# Patient Record
Sex: Female | Born: 1975 | Race: White | Hispanic: No | Marital: Married | Smoking: Never smoker
Health system: Southern US, Community
[De-identification: ages and names within clinical notes are randomized; demographics above are authoritative.]

## PROBLEM LIST (undated history)

## (undated) DIAGNOSIS — E669 Obesity, unspecified: Secondary | ICD-10-CM

## (undated) HISTORY — PX: ESSURE TUBAL LIGATION: SUR464

## (undated) HISTORY — DX: Obesity, unspecified: E66.9

---

## 2007-02-20 ENCOUNTER — Ambulatory Visit: Payer: Self-pay | Admitting: Obstetrics & Gynecology

## 2007-03-19 ENCOUNTER — Ambulatory Visit: Payer: Self-pay | Admitting: Obstetrics and Gynecology

## 2007-03-19 ENCOUNTER — Encounter: Payer: Self-pay | Admitting: Obstetrics and Gynecology

## 2007-03-22 ENCOUNTER — Ambulatory Visit (HOSPITAL_COMMUNITY): Admission: RE | Admit: 2007-03-22 | Discharge: 2007-03-22 | Payer: Self-pay | Admitting: Obstetrics & Gynecology

## 2007-04-12 ENCOUNTER — Ambulatory Visit (HOSPITAL_COMMUNITY): Admission: RE | Admit: 2007-04-12 | Discharge: 2007-04-12 | Payer: Self-pay | Admitting: Obstetrics & Gynecology

## 2007-04-17 ENCOUNTER — Ambulatory Visit: Payer: Self-pay | Admitting: Obstetrics & Gynecology

## 2007-04-24 ENCOUNTER — Ambulatory Visit (HOSPITAL_COMMUNITY): Admission: RE | Admit: 2007-04-24 | Discharge: 2007-04-24 | Payer: Self-pay | Admitting: Gynecology

## 2007-05-15 ENCOUNTER — Ambulatory Visit: Payer: Self-pay | Admitting: Obstetrics & Gynecology

## 2007-06-03 ENCOUNTER — Ambulatory Visit: Payer: Self-pay | Admitting: Family

## 2007-07-01 ENCOUNTER — Ambulatory Visit (HOSPITAL_COMMUNITY): Admission: RE | Admit: 2007-07-01 | Discharge: 2007-07-01 | Payer: Self-pay | Admitting: Obstetrics & Gynecology

## 2007-07-01 ENCOUNTER — Ambulatory Visit: Payer: Self-pay | Admitting: Family

## 2007-07-17 ENCOUNTER — Ambulatory Visit: Payer: Self-pay | Admitting: Obstetrics & Gynecology

## 2007-07-22 ENCOUNTER — Ambulatory Visit: Payer: Self-pay | Admitting: Obstetrics & Gynecology

## 2007-07-23 ENCOUNTER — Ambulatory Visit (HOSPITAL_COMMUNITY): Admission: RE | Admit: 2007-07-23 | Discharge: 2007-07-23 | Payer: Self-pay | Admitting: Obstetrics & Gynecology

## 2007-07-31 ENCOUNTER — Ambulatory Visit: Payer: Self-pay | Admitting: Obstetrics & Gynecology

## 2007-08-14 ENCOUNTER — Ambulatory Visit: Payer: Self-pay | Admitting: Obstetrics & Gynecology

## 2007-08-16 ENCOUNTER — Ambulatory Visit: Payer: Self-pay | Admitting: Obstetrics & Gynecology

## 2007-08-16 ENCOUNTER — Ambulatory Visit (HOSPITAL_COMMUNITY): Admission: RE | Admit: 2007-08-16 | Discharge: 2007-08-16 | Payer: Self-pay | Admitting: Obstetrics & Gynecology

## 2007-08-28 ENCOUNTER — Ambulatory Visit: Payer: Self-pay | Admitting: Obstetrics & Gynecology

## 2007-08-31 ENCOUNTER — Inpatient Hospital Stay (HOSPITAL_COMMUNITY): Admission: AD | Admit: 2007-08-31 | Discharge: 2007-09-03 | Payer: Self-pay | Admitting: Obstetrics & Gynecology

## 2007-08-31 ENCOUNTER — Ambulatory Visit: Payer: Self-pay | Admitting: Obstetrics and Gynecology

## 2007-11-26 ENCOUNTER — Ambulatory Visit: Payer: Self-pay | Admitting: Obstetrics & Gynecology

## 2008-02-18 ENCOUNTER — Ambulatory Visit: Payer: Self-pay | Admitting: Obstetrics & Gynecology

## 2008-05-11 ENCOUNTER — Ambulatory Visit: Payer: Self-pay | Admitting: Obstetrics & Gynecology

## 2008-08-04 ENCOUNTER — Ambulatory Visit: Payer: Self-pay | Admitting: Family Medicine

## 2008-08-04 DIAGNOSIS — J310 Chronic rhinitis: Secondary | ICD-10-CM | POA: Insufficient documentation

## 2008-08-17 ENCOUNTER — Ambulatory Visit: Payer: Self-pay | Admitting: Obstetrics & Gynecology

## 2008-11-23 ENCOUNTER — Ambulatory Visit: Payer: Self-pay | Admitting: Obstetrics & Gynecology

## 2008-12-02 ENCOUNTER — Encounter: Payer: Self-pay | Admitting: Obstetrics & Gynecology

## 2008-12-02 ENCOUNTER — Ambulatory Visit: Payer: Self-pay | Admitting: Obstetrics & Gynecology

## 2009-01-12 ENCOUNTER — Ambulatory Visit: Payer: Self-pay | Admitting: Family Medicine

## 2009-01-12 DIAGNOSIS — R635 Abnormal weight gain: Secondary | ICD-10-CM | POA: Insufficient documentation

## 2009-01-13 LAB — CONVERTED CEMR LAB
AST: 15 units/L (ref 0–37)
Albumin: 4.3 g/dL (ref 3.5–5.2)
Alkaline Phosphatase: 60 units/L (ref 39–117)
BUN: 18 mg/dL (ref 6–23)
Glucose, Bld: 85 mg/dL (ref 70–99)
HDL: 45 mg/dL (ref >39)
LDL Cholesterol: 168 mg/dL — ABNORMAL HIGH (ref 0–99)
Potassium: 5 meq/L (ref 3.5–5.3)
Sodium: 140 meq/L (ref 135–145)
TSH: 2.013 microintl units/mL (ref 0.350–4.500)
Total Bilirubin: 0.7 mg/dL (ref 0.3–1.2)
Triglycerides: 99 mg/dL (ref <150)
VLDL: 20 mg/dL (ref 0–40)

## 2009-03-31 ENCOUNTER — Ambulatory Visit: Payer: Self-pay | Admitting: Obstetrics & Gynecology

## 2009-04-20 IMAGING — US US OB COMP +14 WK
1 series · 14 of 28 positions shown · non-contrast
Comparison: none

OBSTETRICAL ULTRASOUND:

 This ultrasound exam was performed in the [HOSPITAL] Ultrasound Department.  The OB US report was generated in the AS system, and faxed to the ordering physician.  This report is also available in [REDACTED] PACS.

[Series 1: us ob comp +14 wk · 14 of 88 slices shown]
[im 4/88]
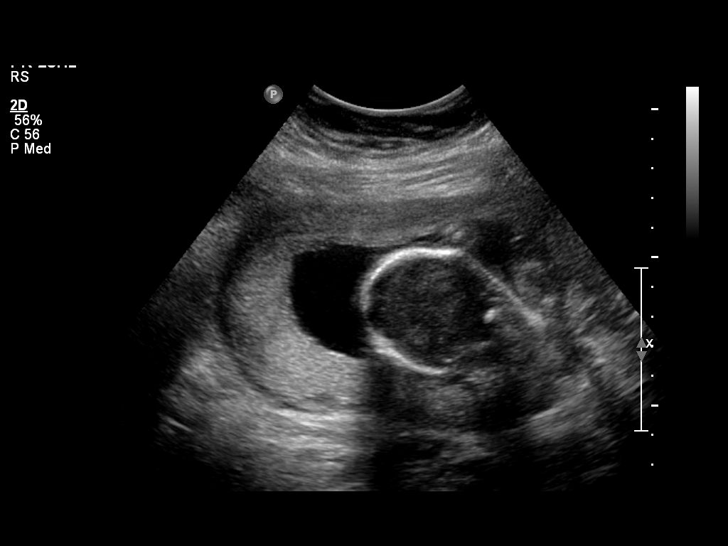
[im 10/88]
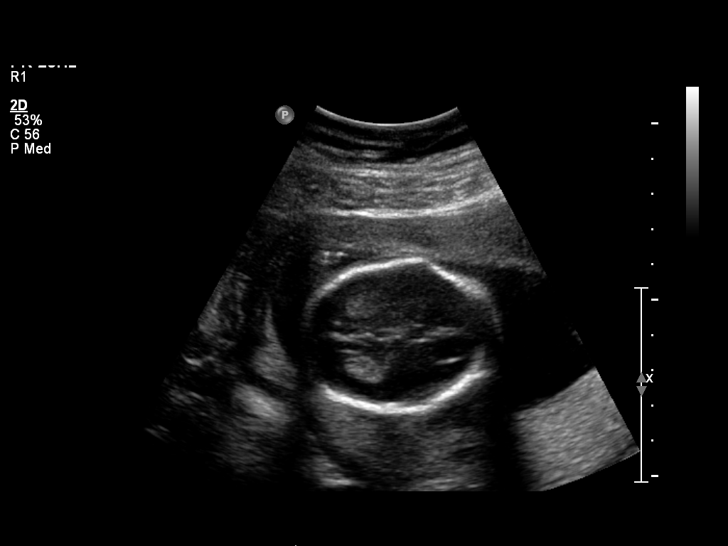
[im 17/88]
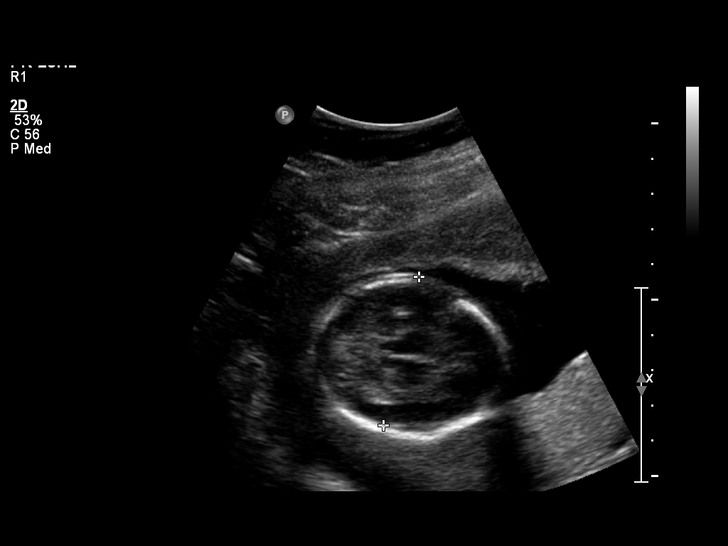
[im 23/88]
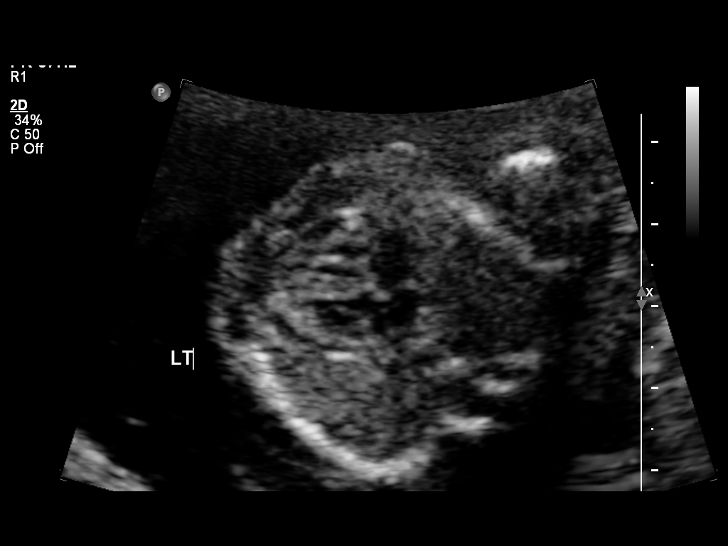
[im 30/88]
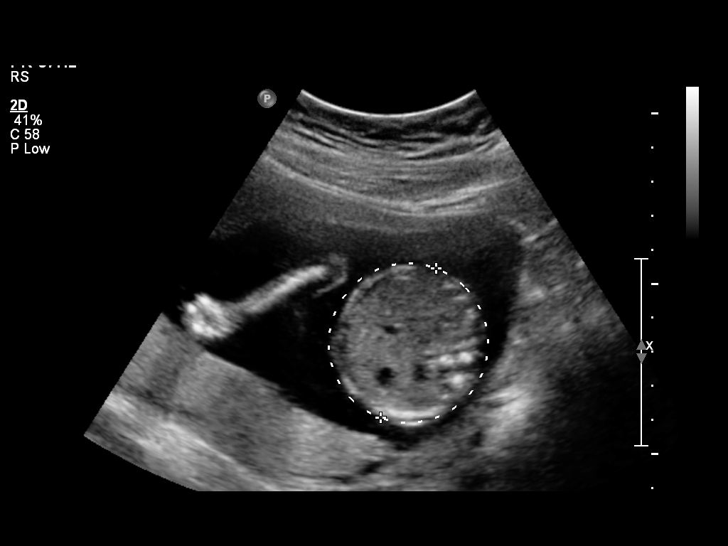
[im 36/88]
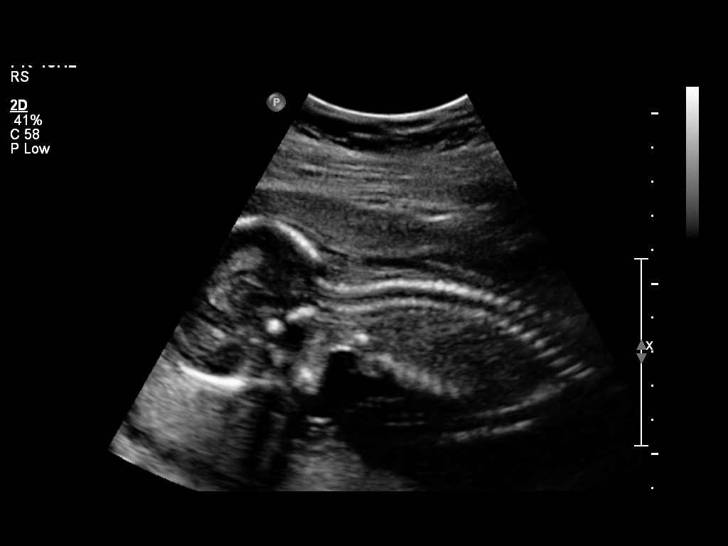
[im 42/88]
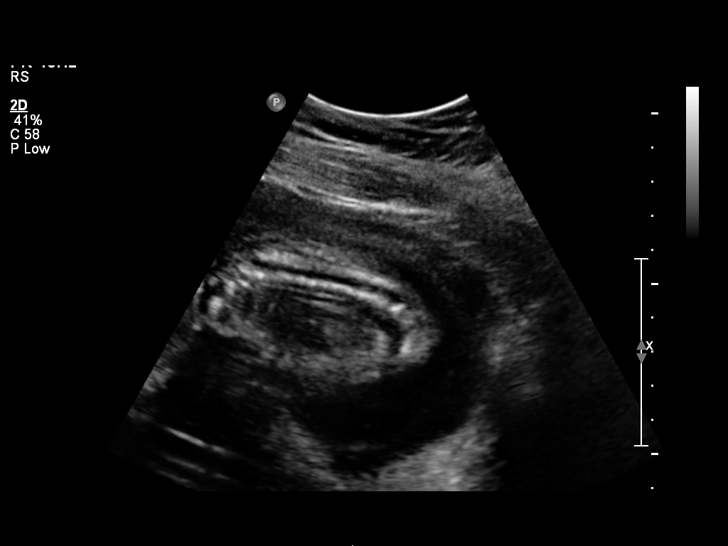
[im 49/88]
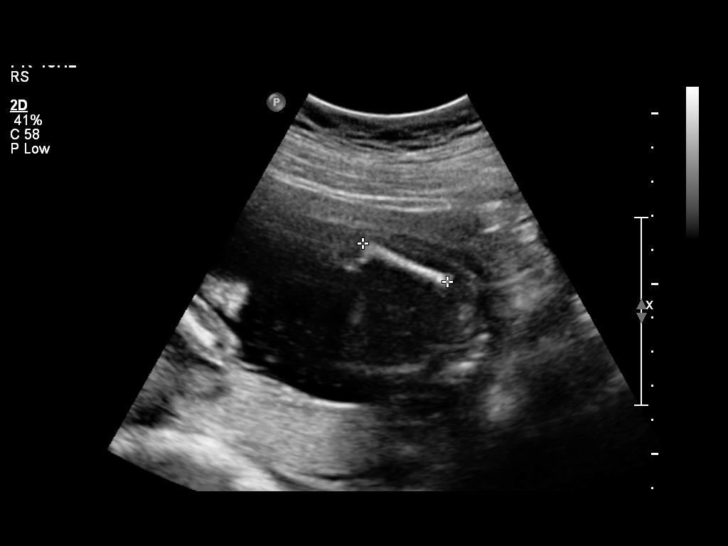
[im 55/88]
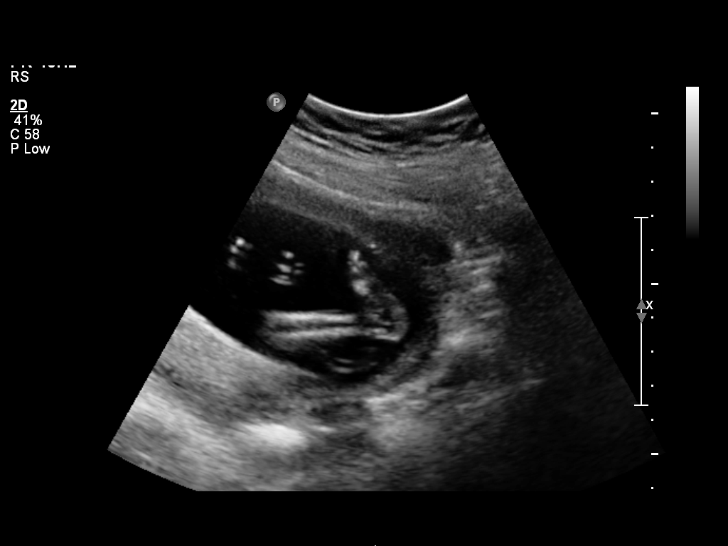
[im 62/88]
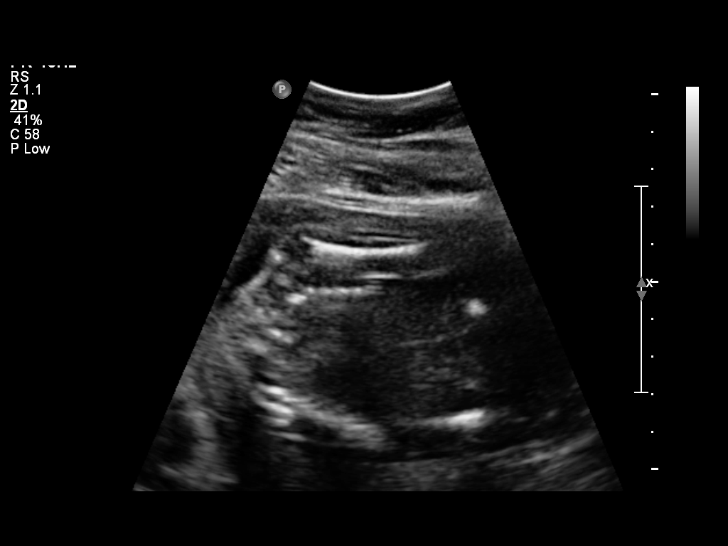
[im 68/88]
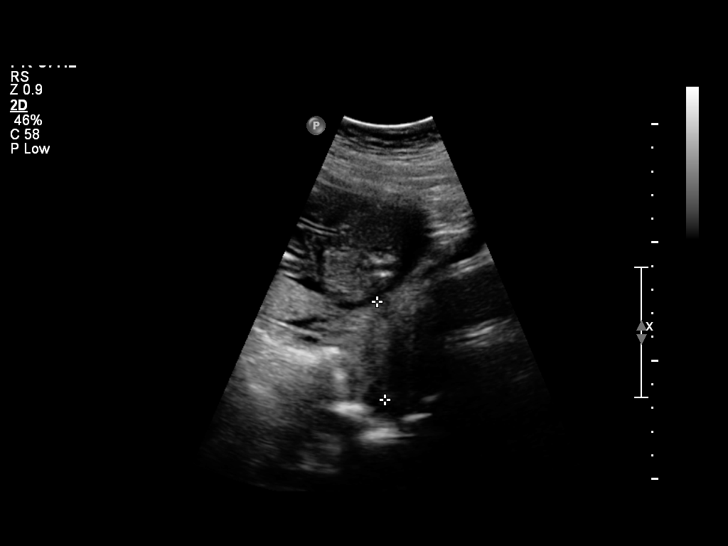
[im 75/88]
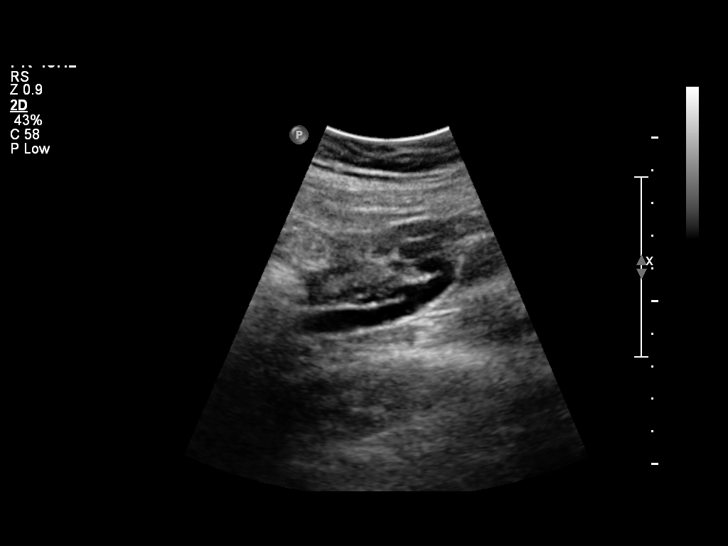
[im 81/88]
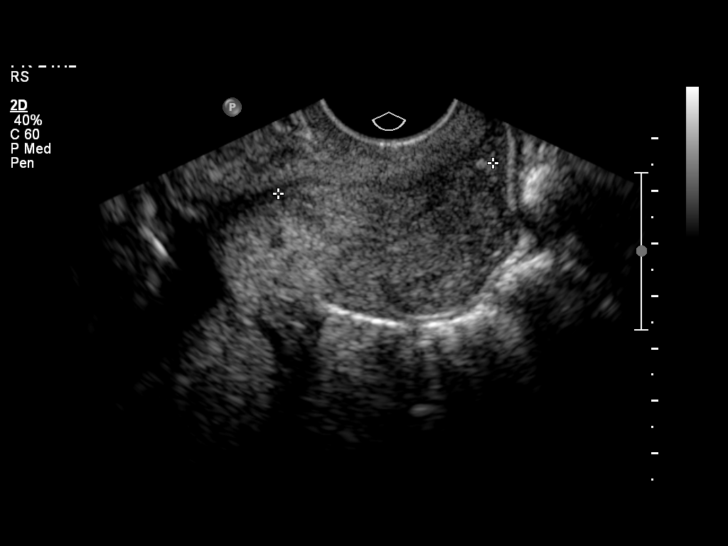
[im 88/88]
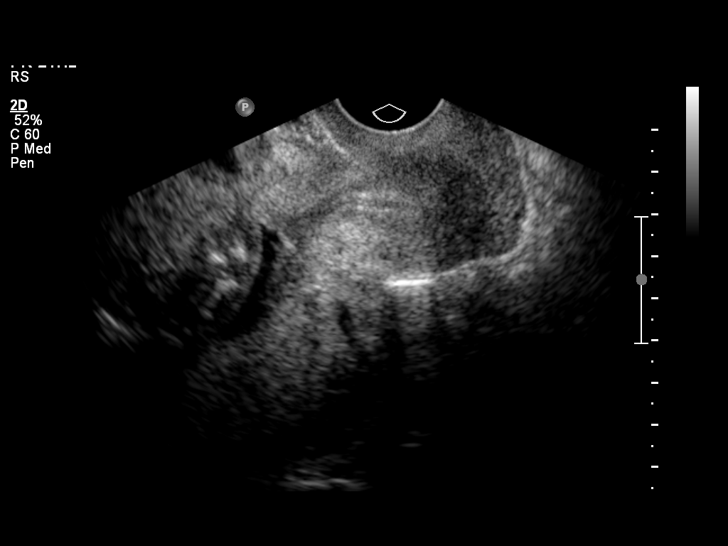

[14 of 28 positions shown; findings below may reference images not displayed]

IMPRESSION: See AS Obstetric US report.

## 2010-03-11 ENCOUNTER — Ambulatory Visit: Admit: 2010-03-11 | Payer: Self-pay | Admitting: Family Medicine

## 2010-05-31 ENCOUNTER — Encounter: Payer: Self-pay | Admitting: Family Medicine

## 2010-06-06 ENCOUNTER — Ambulatory Visit (INDEPENDENT_AMBULATORY_CARE_PROVIDER_SITE_OTHER): Payer: 59 | Admitting: Family Medicine

## 2010-06-06 ENCOUNTER — Encounter: Payer: Self-pay | Admitting: Family Medicine

## 2010-06-06 DIAGNOSIS — Z130 Encounter for screening for diseases of the blood and blood-forming organs and certain disorders involving the immune mechanism: Secondary | ICD-10-CM

## 2010-06-06 DIAGNOSIS — Z0000 Encounter for general adult medical examination without abnormal findings: Secondary | ICD-10-CM

## 2010-06-06 DIAGNOSIS — Z13228 Encounter for screening for other metabolic disorders: Secondary | ICD-10-CM

## 2010-06-06 DIAGNOSIS — Z13220 Encounter for screening for lipoid disorders: Secondary | ICD-10-CM

## 2010-06-06 DIAGNOSIS — N920 Excessive and frequent menstruation with regular cycle: Secondary | ICD-10-CM

## 2010-06-06 NOTE — Progress Notes (Signed)
  Subjective:    Patient ID: Rebekah Porter, female    DOB: November 03, 1975, 35 y.o.   MRN: [id number]  HPI [age] yo WF presents for CPE without pap smear.  LMP was 2 wks ago. Premenopausal.  Uses Essure. for contraception.  No hx of abnormal pap.  Denies irregular bleeding or vag discharge but her periods are heavier since she had Essure last year.  She has no fam hx of breast or colon cancer.  Her last tetanus vaccine was2008..  She eats fairly poorly and does not exercises on a regular basis.  Denies fam hx of premature heart disease.      MVI/ Calcium with D- not taking. BP 123/73  Pulse 75  Ht 5\' 5"  (1.651 m)  Wt 185 lb (83.915 kg)  BMI 30.79 kg/m2  SpO2 97%  LMP 06/05/2010  Past Medical History  Diagnosis Date  . Obesity     No past surgical history on file.  Family History  Problem Relation Age of Onset  . Asthma Mother     chronic    History   Social History  . Marital Status: Married    Spouse Name: N/A    Number of Children: N/A  . Years of Education: N/A   Occupational History  . Not on file.   Social History Main Topics  . Smoking status: Never Smoker   . Smokeless tobacco: Not on file  . Alcohol Use: No  . Drug Use:   . Sexually Active:    Other Topics Concern  . Not on file   Social History Narrative  . No narrative on file    Not on File  Current outpatient prescriptions:MedroxyPROGESTERone Acetate (DEPO-PROVERA IM), Inject into the muscle as directed.  , Disp: , Rfl:    Review of Systems Gen: no fevers, chills, hot flashes, night sweats, change in weight GI: no N/V/C/D GU: no dysuria, incontinence or sexual dysfunction CV: no chest pain, DOE, palpitations s or edema Pulm:  Denies CP, SOB or chronic cough      Objective:   Physical Exam Gen: alert, well groomed in NAD, overwt. Neck: no thyromegaly or cervical lymphadenopathy CV: RRR w/o murmur, no audible carotid bruits or abdominal aortic bruits,2+ pedal pulses. Ext: no edema, clubbing or  cyanosis Lungs: CTA bilat w/o W/R/R; nonlabored HEENT:  [place]/AT; PERRLA; oropharynx pink and moist with good dentition Skin: warm and dry; no rash, pallor or jaundice Psych: does not appear anxious or depressed; answers questions appropriately        Assessment & Plan:  Assesment:  1. CPE- Keeping healthy checklist for women reviewed today.  BP at goal.  BMI 30  in the class I obesity range.     Labs ordered Contraception- Essure last year. Will call her gyn to update her pap this year. Mammogram due at 41. Encouraged healthy diet, regular exercise, MVI daily. Return for next physical in 1 yr.

## 2010-06-09 ENCOUNTER — Telehealth: Payer: Self-pay | Admitting: Family Medicine

## 2010-06-09 LAB — COMPREHENSIVE METABOLIC PANEL
AST: 13 U/L (ref 0–37)
Alkaline Phosphatase: 48 U/L (ref 39–117)
BUN: 18 mg/dL (ref 6–23)
Creat: 0.8 mg/dL (ref 0.40–1.20)
Glucose, Bld: 80 mg/dL (ref 70–99)
Total Bilirubin: 0.4 mg/dL (ref 0.3–1.2)

## 2010-06-09 LAB — LIPID PANEL
HDL: 48 mg/dL (ref >39)
LDL Cholesterol: 136 mg/dL — ABNORMAL HIGH (ref 0–99)
Total CHOL/HDL Ratio: 4.2 Ratio
Triglycerides: 78 mg/dL (ref <150)
VLDL: 16 mg/dL (ref 0–40)

## 2010-06-09 NOTE — Telephone Encounter (Signed)
Pls let pt know that her fasting sugar, liver, kidney function and cholesterol came back normal.  Repeat in 1-2 yrs.

## 2010-06-10 NOTE — Telephone Encounter (Signed)
LMOM informing Pt of the above 

## 2010-06-22 ENCOUNTER — Ambulatory Visit: Payer: 59 | Admitting: Obstetrics & Gynecology

## 2010-07-26 NOTE — Assessment & Plan Note (Signed)
Rebekah Porter, SALVADOR NO.:  [id number]   MEDICAL RECORD NO.:  [id number]          PATIENT TYPE:  POB   LOCATION:  [facility] at [place]         FACILITY:  [facility]   PHYSICIAN:  Allie Bossier, MD        DATE OF BIRTH:  03-20-1975   DATE OF SERVICE:  12/02/2008                                  CLINIC NOTE   Ms. Alberta is a 35 year old married white gravida 1, para 1.  She has a  69-month-old son named Marcello Moores.  She comes in here for annual exam.  She  has no particular GYN complaints.  She does say that she is having  difficulty losing weight.  She currently weighs 191 pounds.  She uses  Depo-Provera for birth control.   PAST MEDICAL HISTORY:  She is overweight/obesity.   PAST SURGICAL HISTORY:  None.   REVIEW OF SYSTEMS:  She has been married for 7 years.  She is a  Futures trader.  She does not desire more children.  Her last Pap smear was  done in January 2009 and it was normal.   SOCIAL HISTORY:  Negative for tobacco, alcohol, or drug use.   ALLERGIES:  No known drug allergies.  No latex allergies.   FAMILY HISTORY:  Negative for breast, GYN, and colon malignancies.   MEDICATIONS:  1. Over-the-counter prenatal vitamins daily.  2. Depo-Provera q.12 weeks.   PHYSICAL EXAMINATION:  VITAL SIGNS:  Weight 191 pounds, height 5 feet 5,  blood pressure 125/79, and pulse 81.  HEENT:  Normal.  HEART:  Regular rate and rhythm.  LUNGS:  Clear to auscultation bilaterally.  BREASTS:  Normal bilaterally.  ABDOMEN:  Obese and benign.  No hepatosplenomegaly.  EXTERNAL GENITALIA:  No lesions.  Cervix parous.  No lesions or abnormal  discharge.  Uterus normal size and shape midplane, minimally mobile.  Adnexa nontender.  No masses.   ASSESSMENT/PLAN:  1. Annual exam.  I have checked a Pap smear and recommended self-      breast and self-vulvar exams monthly.  2. Birth control.  We have discussed Mirena and she will plan on      scheduling an insertion.  This will take away  Depo-Provera, so that      she can hopefully lose weight more easily.      Allie Bossier, MD     MCD/MEDQ  D:  12/02/2008  T:  12/03/2008  Job:  [id number]

## 2010-07-26 NOTE — Assessment & Plan Note (Signed)
Rebekah Porter, Rebekah Porter NO.:  [id number]   MEDICAL RECORD NO.:  [id number]          PATIENT TYPE:  POB   LOCATION:  [facility] [place]         FACILITY:  [facility]   PHYSICIAN:  Rebekah Lincoln, MD      DATE OF BIRTH:  March 18, 1975   DATE OF SERVICE:  03/31/2009                                  CLINIC NOTE   This is a 35 year old female who presents for discussion of birth  control options.  The patient was off Depo-Provera.  Her last shot was  in September.  She was due in December.  She wants the Essure procedure,  but she cannot afford it doing in the hospital as her insurance will  charge her 3000 dollars.  She has an appointment with [facility] to discuss  the in-office procedure in February.  She would like to do with [place], but  we are not set up to do it in the office.  She would like to be off the  Depo, so we discussed oral contraceptives.  She has been on oral  contraceptives in the past.  She has been on Ortho Tri-Cyclen.  Gave  samples of Ortho Tri-Cyclen Lo.  I gave her 2 samples of Ortho Tri-  Cyclen Lo and a prescription for Ortho Tri-Cyclen Lo.   MEDICAL HISTORY:  She has never had any history of hypertension, stroke,  heart attack, blood clots in her legs and lungs.  Her blood pressure  today is 126/77.  She is familiar how to take the birth control pill and  how to take them if she misses a dose.  The patient will return to [place]  for regular GYN care.  If the patient does not like [facility], I can find  her another Miniya Miguez you can do it in office.  I wish that we could  provide the service for her in office, but we have not set up yet.  The  patient is to start the birth control pill with onset of her menses.           ______________________________  Rebekah Lincoln, MD     KL/MEDQ  D:  03/31/2009  T:  04/01/2009  Job:  [id number]

## 2010-12-08 LAB — CBC
HCT: 35.3 — ABNORMAL LOW
Platelets: 304
RDW: 13.2
WBC: 10.7 — ABNORMAL HIGH

## 2010-12-08 LAB — RPR: RPR Ser Ql: NONREACTIVE

## 2011-01-29 ENCOUNTER — Emergency Department (INDEPENDENT_AMBULATORY_CARE_PROVIDER_SITE_OTHER)
Admission: EM | Admit: 2011-01-29 | Discharge: 2011-01-29 | Disposition: A | Discharge status: Home / Self Care | Payer: 59 | Source: Home / Self Care

## 2011-01-29 DIAGNOSIS — R059 Cough, unspecified: Secondary | ICD-10-CM

## 2011-01-29 DIAGNOSIS — R05 Cough: Secondary | ICD-10-CM

## 2011-01-29 DIAGNOSIS — J069 Acute upper respiratory infection, unspecified: Secondary | ICD-10-CM

## 2011-01-29 MED ORDER — AZITHROMYCIN 250 MG PO TABS: ORAL_TABLET | ORAL | Status: AC

## 2011-01-29 MED ORDER — GUAIFENESIN-CODEINE 100-10 MG/5ML PO SYRP: 5.0000 mL | ORAL_SOLUTION | Freq: Three times a day (TID) | ORAL | Status: AC | PRN

## 2011-01-29 NOTE — ED Notes (Signed)
States symptoms started a week ago

## 2011-01-29 NOTE — ED Provider Notes (Signed)
History     CSN: [id number] Arrival date & time: 01/29/2011  5:50 PM   None     Chief Complaint  Patient presents with  . Cough    (Consider location/radiation/quality/duration/timing/severity/associated sxs/prior treatment) HPI Rebekah Porter is a 35 y.o. female who complains of onset of cold symptoms for a few days.  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  She is mostly concerned because she has a child, who has asthma and she does not want them to get sick. + sore throat + cough No pleuritic pain No wheezing + nasal congestion + post-nasal drainage + sinus pain/pressure + chest congestion No itchy/red eyes No earache No hemoptysis No SOB No chills/sweats No fever No nausea No vomiting No abdominal pain No diarrhea No skin rashes No fatigue No myalgias No headache     Past Medical History  Diagnosis Date  . Obesity     History reviewed. No pertinent past surgical history.  Family History  Problem Relation Age of Onset  . Asthma Mother     chronic    History  Substance Use Topics  . Smoking status: Never Smoker   . Smokeless tobacco: Not on file  . Alcohol Use: No    OB History    Grav Para Term Preterm Abortions TAB SAB Ect Mult Living                  Review of Systems  Allergies  Review of patient's allergies indicates no known allergies.  Home Medications   Current Outpatient Rx  Name Route Sig Dispense Refill  . AZITHROMYCIN 250 MG PO TABS  Use as directed 1 each 0  . GUAIFENESIN-CODEINE 100-10 MG/5ML PO SYRP Oral Take 5 mLs by mouth 3 (three) times daily as needed for cough. 120 mL 0    BP 111/75  Temp(Src) 98.5 F (36.9 C) (Oral)  Resp 24  Ht 5\' 5"  (1.651 m)  Wt 182 lb (82.555 kg)  BMI 30.29 kg/m2  SpO2 99%  LMP 01/28/2011  Physical Exam  Nursing note and vitals reviewed. Constitutional: She is oriented to person, place, and time. She appears well-developed and well-nourished.  HENT:  Head:  Normocephalic and atraumatic.  Right Ear: Tympanic membrane, external ear and ear canal normal.  Left Ear: Tympanic membrane, external ear and ear canal normal.  Nose: Mucosal edema and rhinorrhea present.  Mouth/Throat: Posterior oropharyngeal erythema present. No oropharyngeal exudate or posterior oropharyngeal edema.  Neck: Neck supple.  Cardiovascular: Regular rhythm and normal heart sounds.   Pulmonary/Chest: Effort normal and breath sounds normal. No respiratory distress.  Neurological: She is alert and oriented to person, place, and time.  Skin: Skin is warm and dry.  Psychiatric: She has a normal mood and affect. Her speech is normal.    ED Course  Procedures (including critical care time)  Labs Reviewed - No data to display No results found.   1. Acute upper respiratory infections of unspecified site   2. Cough       MDM   1)  Take the prescribed antibiotic as instructed. 2)  Use nasal saline solution (over the counter) at least 3 times a day. 3)  Use over the counter decongestants like Zyrtec-D every 12 hours as needed to help with congestion.  If you have hypertension, do not take medicines with sudafed.  4)  Can take tylenol every 6 hours or motrin every 8 hours for pain or fever. 5)  Follow  up with your primary doctor if no improvement in 5-7 days, sooner if increasing pain, fever, or new symptoms.         Lily Kocher, MD 01/29/11 [phone]

## 2011-02-16 ENCOUNTER — Emergency Department
Admission: EM | Admit: 2011-02-16 | Discharge: 2011-02-16 | Disposition: A | Discharge status: Home / Self Care | Payer: 59 | Source: Home / Self Care | Attending: Emergency Medicine | Admitting: Emergency Medicine

## 2011-02-16 ENCOUNTER — Encounter: Payer: Self-pay | Admitting: *Deleted

## 2011-02-16 DIAGNOSIS — J111 Influenza due to unidentified influenza virus with other respiratory manifestations: Secondary | ICD-10-CM

## 2011-02-16 NOTE — ED Provider Notes (Signed)
History     CSN: [id number] Arrival date & time: 02/16/2011 10:58 AM   First MD Initiated Contact with Patient 02/16/11 1040      Chief Complaint  Patient presents with  . Cough    (Consider location/radiation/quality/duration/timing/severity/associated sxs/prior treatment) HPI Yoanna is a 35 y.o. female who complains of onset of cold symptoms for 5  days. Her son has been diagnosed with influenza by lab test. No sore throat + cough No pleuritic pain No wheezing No nasal congestion No post-nasal drainage No sinus pain/pressure No chest congestion No itchy/red eyes No earache No hemoptysis No SOB + chills/sweats + fever No nausea No vomiting No abdominal pain No diarrhea No skin rashes + fatigue + myalgias + headache    Past Medical History  Diagnosis Date  . Obesity     History reviewed. No pertinent past surgical history.  Family History  Problem Relation Age of Onset  . Asthma Mother     chronic    History  Substance Use Topics  . Smoking status: Never Smoker   . Smokeless tobacco: Not on file  . Alcohol Use: No    OB History    Grav Para Term Preterm Abortions TAB SAB Ect Mult Living                  Review of Systems  Allergies  Review of patient's allergies indicates no known allergies.  Home Medications  No current outpatient prescriptions on file.  BP 103/74  Pulse 110  Temp(Src) 98.9 F (37.2 C) (Oral)  Resp 16  Ht 5\' 5"  (1.651 m)  Wt 179 lb (81.194 kg)  BMI 29.79 kg/m2  SpO2 98%  LMP 01/28/2011  Physical Exam  Nursing note and vitals reviewed. Constitutional: She is oriented to person, place, and time. She appears well-developed and well-nourished.  HENT:  Head: Normocephalic and atraumatic.  Right Ear: Tympanic membrane, external ear and ear canal normal.  Left Ear: Tympanic membrane, external ear and ear canal normal.  Nose: Mucosal edema and rhinorrhea present.  Mouth/Throat: Posterior oropharyngeal erythema  present. No oropharyngeal exudate or posterior oropharyngeal edema.  Neck: Neck supple.  Cardiovascular: Regular rhythm and normal heart sounds.   Pulmonary/Chest: Effort normal and breath sounds normal. No respiratory distress.  Neurological: She is alert and oriented to person, place, and time.  Skin: Skin is warm and dry.  Psychiatric: She has a normal mood and affect. Her speech is normal.    ED Course  Procedures (including critical care time)  Labs Reviewed - No data to display No results found.   No diagnosis found.    MDM  1) her symptoms are consistent with the influenza. I did not test her because it started in 5 days and she is already getting better. I gave her a note for work.Marland Kitchen 2)  Use nasal saline solution (over the counter) at least 3 times a day. 3)  Use over the counter decongestants like Zyrtec-D every 12 hours as needed to help with congestion.  If you have hypertension, do not take medicines with sudafed.  4)  Can take tylenol every 6 hours or motrin every 8 hours for pain or fever. 5)  Follow up with your primary doctor if no improvement in 5-7 days, sooner if increasing pain, fever, or new symptoms.       Lily Kocher, MD 02/16/11 1110

## 2011-02-16 NOTE — ED Notes (Signed)
Patient c/o weakness, body aches, cough and fever x 5 days. Yesterday was "the worst of it", she is improving today and has not had a feer. Her son tested positive for the flu.

## 2011-03-16 ENCOUNTER — Ambulatory Visit: Payer: 59 | Admitting: Family Medicine

## 2011-03-16 DIAGNOSIS — Z0289 Encounter for other administrative examinations: Secondary | ICD-10-CM

## 2011-12-07 ENCOUNTER — Ambulatory Visit: Payer: 59 | Admitting: Obstetrics & Gynecology

## 2011-12-07 DIAGNOSIS — Z124 Encounter for screening for malignant neoplasm of cervix: Secondary | ICD-10-CM

## 2013-05-09 ENCOUNTER — Ambulatory Visit: Payer: 59 | Admitting: Physician Assistant

## 2013-05-09 ENCOUNTER — Ambulatory Visit (INDEPENDENT_AMBULATORY_CARE_PROVIDER_SITE_OTHER): Payer: 59 | Admitting: Physician Assistant

## 2013-05-09 ENCOUNTER — Encounter: Payer: Self-pay | Admitting: Physician Assistant

## 2013-05-09 VITALS — BP 109/67 | HR 79 | Ht 65.0 in | Wt 190.0 lb

## 2013-05-09 DIAGNOSIS — E669 Obesity, unspecified: Secondary | ICD-10-CM | POA: Insufficient documentation

## 2013-05-09 DIAGNOSIS — Z131 Encounter for screening for diabetes mellitus: Secondary | ICD-10-CM

## 2013-05-09 DIAGNOSIS — Z13220 Encounter for screening for lipoid disorders: Secondary | ICD-10-CM

## 2013-05-09 DIAGNOSIS — Z6831 Body mass index (BMI) 31.0-31.9, adult: Secondary | ICD-10-CM

## 2013-05-09 DIAGNOSIS — R635 Abnormal weight gain: Secondary | ICD-10-CM

## 2013-05-09 MED ORDER — PHENTERMINE HCL 37.5 MG PO TABS: 37.5000 mg | ORAL_TABLET | Freq: Every day | ORAL | Status: DC

## 2013-05-11 NOTE — Progress Notes (Signed)
   Subjective:    Patient ID: Rebekah Porter, female    DOB: [birth year], 38 y.o.   MRN: [id number]  HPI Pt is a 38 yo female who presents to the clinic to establish care. Pt is not on any medications.   . Active Ambulatory Problems    Diagnosis Date Noted  . RHINITIS 08/04/2008  . WEIGHT GAIN 01/12/2009  . Routine general medical examination at a health care facility 06/06/2010  . BMI 31.0-31.9,adult 05/09/2013   Resolved Ambulatory Problems    Diagnosis Date Noted  . No Resolved Ambulatory Problems   Past Medical History  Diagnosis Date  . Obesity    . History   Social History  . Marital Status: Married    Spouse Name: N/A    Number of Children: N/A  . Years of Education: N/A   Occupational History  . Not on file.   Social History Main Topics  . Smoking status: Never Smoker   . Smokeless tobacco: Not on file  . Alcohol Use: No  . Drug Use: No  . Sexual Activity: Yes   Other Topics Concern  . Not on file   Social History Narrative  . No narrative on file   . Family History  Problem Relation Age of Onset  . Asthma Mother     chronic  . Alcohol abuse Father   . Diabetes Paternal Grandmother   . Hypertension Paternal Grandmother     Pt would like help losing weight. She admits to over eating but needs jump start. No current exercise. She has tried fad diets but has not been sticking to them.    Review of Systems  All other systems reviewed and are negative.       Objective:   Physical Exam  Constitutional: She is oriented to person, place, and time. She appears well-developed and well-nourished.  Obese.  HENT:  Head: Normocephalic and atraumatic.  Cardiovascular: Normal rate, regular rhythm and normal heart sounds.   Pulmonary/Chest: Effort normal and breath sounds normal. She has no wheezes.  Neurological: She is alert and oriented to person, place, and time.  Skin: Skin is dry.  Psychiatric: She has a normal mood and affect. Her behavior is  normal.          Assessment & Plan:  Abnormal weight gain/BMI 31- Depression screen was negative at 0/2. Needs screening labs ordered today. Discussed my fitness pal and limiting calories. Encouraged pt to start exercising 150 minutes a week. Discussed belviq, contrave, qysmia. Will start with phentermine. Discussed SE of phentermine and if she has any palpitations, insomnia to call office and stop medication . Will follow up in 1 month. Would like for her to see me to discuss diet and exerise. Pt aware she will have to make those changes for medication to work.

## 2013-05-15 LAB — COMPLETE METABOLIC PANEL WITH GFR
ALBUMIN: 4.5 g/dL (ref 3.5–5.2)
ALK PHOS: 54 U/L (ref 39–117)
ALT: 15 U/L (ref 0–35)
AST: 15 U/L (ref 0–37)
BILIRUBIN TOTAL: 0.6 mg/dL (ref 0.2–1.2)
BUN: 21 mg/dL (ref 6–23)
CO2: 27 mEq/L (ref 19–32)
Calcium: 9.5 mg/dL (ref 8.4–10.5)
Chloride: 104 mEq/L (ref 96–112)
Creat: 0.83 mg/dL (ref 0.50–1.10)
GFR, Est African American: >89 mL/min
GFR, Est Non African American: >89 mL/min
Glucose, Bld: 80 mg/dL (ref 70–99)
POTASSIUM: 5.1 meq/L (ref 3.5–5.3)
SODIUM: 139 meq/L (ref 135–145)
Total Protein: 6.9 g/dL (ref 6.0–8.3)

## 2013-05-15 LAB — LIPID PANEL
CHOL/HDL RATIO: 5.5 ratio
Cholesterol: 264 mg/dL — ABNORMAL HIGH (ref 0–200)
HDL: 48 mg/dL (ref >39)
LDL Cholesterol: 204 mg/dL — ABNORMAL HIGH (ref 0–99)
Triglycerides: 61 mg/dL (ref <150)
VLDL: 12 mg/dL (ref 0–40)

## 2013-05-16 LAB — VITAMIN D 25 HYDROXY (VIT D DEFICIENCY, FRACTURES): Vit D, 25-Hydroxy: 45 ng/mL (ref 30–89)

## 2013-05-16 LAB — TSH: TSH: 3.019 u[IU]/mL (ref 0.350–4.500)

## 2013-05-16 LAB — VITAMIN B12: VITAMIN B 12: 436 pg/mL (ref 211–911)

## 2013-05-20 ENCOUNTER — Other Ambulatory Visit: Payer: Self-pay | Admitting: Physician Assistant

## 2013-05-20 MED ORDER — ATORVASTATIN CALCIUM 40 MG PO TABS: 40.0000 mg | ORAL_TABLET | Freq: Every day | ORAL | Status: DC

## 2013-08-15 ENCOUNTER — Ambulatory Visit (INDEPENDENT_AMBULATORY_CARE_PROVIDER_SITE_OTHER): Payer: 59 | Admitting: Physician Assistant

## 2013-08-15 ENCOUNTER — Encounter: Payer: Self-pay | Admitting: Physician Assistant

## 2013-08-15 VITALS — BP 114/73 | HR 76 | Ht 65.0 in | Wt 198.0 lb

## 2013-08-15 DIAGNOSIS — R635 Abnormal weight gain: Secondary | ICD-10-CM

## 2013-08-15 DIAGNOSIS — R41840 Attention and concentration deficit: Secondary | ICD-10-CM

## 2013-08-15 DIAGNOSIS — Z6831 Body mass index (BMI) 31.0-31.9, adult: Secondary | ICD-10-CM

## 2013-08-15 MED ORDER — NALTREXONE-BUPROPION HCL ER 8-90 MG PO TB12
ORAL_TABLET | ORAL | Status: DC
Start: 2013-08-15 — End: 2014-06-03

## 2013-08-15 MED ORDER — NALTREXONE-BUPROPION HCL ER 8-90 MG PO TB12: ORAL_TABLET | ORAL | Status: DC

## 2013-08-15 NOTE — Patient Instructions (Signed)
Get ADHD testing. Contrave start.   Follow up in 6 weeks.

## 2013-08-15 NOTE — Progress Notes (Signed)
   Subjective:    Patient ID: Rebekah Porter, female    DOB: Jun 07, 1975, 38 y.o.   MRN: [id number]  HPI Patient is a 38 year old female who presents to the clinic to discuss weight loss. Patient has tried phentermine multiple times. The first time she tried phentermine she had significant weight loss at greater than 20 pounds. She quickly gained her weight back and tried it again. Every time she seems to try phentermine again she has less results. She has recently started Weight Watchers along with phentermine and lost 8 pounds. She is very frustrated currently with her weight. She has done some research on her own and has some medications that she would like to try. One medication is called Desoxyn. She is also interested in [place]. Patient are red he has a fifth it and trying to exercise more often. She often wonders about her lack of focus. She can't seem to stay focused on task at home or at work. She can be very focused on symptom but leave the room and completely forget what she is doing. She has never been tested for ADHD. She wonders if this could be affecting her weight loss as well. She is to have is she's ever been in her life. She denies any side effects with current dose of phentermine.   Review of Systems  All other systems reviewed and are negative.      Objective:   Physical Exam  Constitutional: She appears well-developed and well-nourished.  Obese  HENT:  Head: Normocephalic.  Cardiovascular: Normal rate, regular rhythm and normal heart sounds.   Pulmonary/Chest: Effort normal.  Psychiatric: She has a normal mood and affect. Her behavior is normal.          Assessment & Plan:  Obesity-BMI greater than 31- after discussing multiple weight loss options. We have come to the conclusion to try contrave. Patient was given coupon car along with informational a clip. Patient encouraged to sign up for the scale down program. Would like to recheck patient in 6 weeks. Side  effects of medication discussed. Discuss that she is not going to fill the immediate reaction like she does with phentermine however the medication will work more subtly and she can take for longer period of time. Encouraged patient to continue Weight Watchers. Encouraged patient to continue regular exercise.  Inattention- we'll certainly have patient formally evaluated for ADHD. I am not sure if this is affecting weight loss are not but that could be affecting her quality of life if she is truly not able to focus on task at hand. Will followup after evaluation.  Spent 30 minutes with patient greater than 50% of visit spent counseling patient regarding weight loss and weight loss drugs.

## 2013-08-18 DIAGNOSIS — R41840 Attention and concentration deficit: Secondary | ICD-10-CM | POA: Insufficient documentation

## 2013-09-15 ENCOUNTER — Other Ambulatory Visit: Payer: Self-pay | Admitting: Physician Assistant

## 2013-10-15 ENCOUNTER — Ambulatory Visit: Payer: 59 | Admitting: Psychology

## 2014-06-03 ENCOUNTER — Ambulatory Visit (INDEPENDENT_AMBULATORY_CARE_PROVIDER_SITE_OTHER): Payer: 59 | Admitting: Physician Assistant

## 2014-06-03 ENCOUNTER — Encounter: Payer: Self-pay | Admitting: Physician Assistant

## 2014-06-03 VITALS — BP 115/66 | HR 70 | Ht 65.0 in | Wt 207.0 lb

## 2014-06-03 DIAGNOSIS — F4323 Adjustment disorder with mixed anxiety and depressed mood: Secondary | ICD-10-CM

## 2014-06-03 MED ORDER — CLONAZEPAM 0.5 MG PO TABS: 0.5000 mg | ORAL_TABLET | Freq: Two times a day (BID) | ORAL | Status: DC | PRN

## 2014-06-03 MED ORDER — CITALOPRAM HYDROBROMIDE 10 MG PO TABS: ORAL_TABLET | ORAL | Status: DC

## 2014-06-05 DIAGNOSIS — F4323 Adjustment disorder with mixed anxiety and depressed mood: Secondary | ICD-10-CM | POA: Insufficient documentation

## 2014-06-05 NOTE — Progress Notes (Signed)
   Subjective:    Patient ID: Rebekah Porter, female    DOB: 1975/05/09, 39 y.o.   MRN: [id number]  HPI Pt presents to the clinic for help with her anxiety and depression. Does not have a hx of this. She feels like she is in "flight or fight" all the time. Her mother in law is currently leaving with them and very stressful. Her mother in law is doing a lot of things to get under her skin and her husband is doing nothing about it. She feels like her husband is not listening to her. She feels all a lone. She works from home and around her mother in law in close quarters. Her mother in law has a problem with abusing pain pills as well and she feels like she has to keep up with that. If she tries to talk about this with her husband she blows up and then just wants to get away. She feels she needs some medication to stablize her so she can have hard conversations without blowing up. No suicidal thoughts.    Review of Systems  All other systems reviewed and are negative.      Objective:   Physical Exam  Constitutional: She is oriented to person, place, and time. She appears well-developed and well-nourished.  HENT:  Head: Normocephalic and atraumatic.  Cardiovascular: Normal rate, regular rhythm and normal heart sounds.   Pulmonary/Chest: Effort normal and breath sounds normal.  Neurological: She is alert and oriented to person, place, and time.  Psychiatric: Her behavior is normal.  Very tearful when talking about her circumstances.           Assessment & Plan:  Adjustment disorder for anxiety and depression- GAD-7 was 20. PHQ-9 was 4. Discussed personally counselling. Pt agreed to look into. celexa started with taper up to 20mg  over next 2 weeks. Clonazepam as needed up to twice a day. Abuse potential and not to overuse. Encouraged her to have hard conversations with family and make some boundries. Follow up in 1 month.   Spent 30 minutes with patient and greater than 50 percent of visit  spent counseling pt on treatment plan and setting boundaries.

## 2014-06-08 ENCOUNTER — Ambulatory Visit: Payer: Self-pay | Admitting: Physician Assistant

## 2014-07-03 ENCOUNTER — Ambulatory Visit (INDEPENDENT_AMBULATORY_CARE_PROVIDER_SITE_OTHER): Payer: 59 | Admitting: Physician Assistant

## 2014-07-03 ENCOUNTER — Encounter: Payer: Self-pay | Admitting: Physician Assistant

## 2014-07-03 VITALS — BP 112/72 | HR 66 | Ht 65.0 in | Wt 196.0 lb

## 2014-07-03 DIAGNOSIS — F4323 Adjustment disorder with mixed anxiety and depressed mood: Secondary | ICD-10-CM | POA: Diagnosis not present

## 2014-07-03 MED ORDER — CITALOPRAM HYDROBROMIDE 20 MG PO TABS: 20.0000 mg | ORAL_TABLET | Freq: Every day | ORAL | Status: DC

## 2014-07-03 MED ORDER — CLONAZEPAM 0.5 MG PO TABS: 0.5000 mg | ORAL_TABLET | Freq: Two times a day (BID) | ORAL | Status: DC | PRN

## 2014-07-03 NOTE — Progress Notes (Signed)
   Subjective:    Patient ID: Rebekah Porter, female    DOB: 04-Aug-1975, 39 y.o.   MRN: [id number]  HPI Pt presents to the clinic to follow up 1 month anxiety and depression. She is much improved in last month. She is able to handle more stressful situations. Her mother in law situation is becoming much more bearable. Her relationship with husband is much better. Using celexa 20mg  daily and klonapin only as needed maybe once or twice a week. No suicidall or homicidal thoughts.    Review of Systems  All other systems reviewed and are negative.      Objective:   Physical Exam  Constitutional: She is oriented to person, place, and time. She appears well-developed and well-nourished.  HENT:  Head: Normocephalic and atraumatic.  Cardiovascular: Normal rate, regular rhythm and normal heart sounds.   Pulmonary/Chest: Effort normal and breath sounds normal.  Neurological: She is alert and oriented to person, place, and time.  Skin: Skin is dry.  Psychiatric: She has a normal mood and affect. Her behavior is normal.          Assessment & Plan:  Adjustment disorder of anxiety/depression- PHQ-9 was 3. GAD-7 was 1. Doing great. Refilled celexa 20mg  for 6 months. klonapin small amt pt only taking 1 or 2 a week.

## 2014-08-18 ENCOUNTER — Other Ambulatory Visit: Payer: Self-pay | Admitting: Family Medicine

## 2015-02-01 ENCOUNTER — Telehealth: Payer: Self-pay

## 2015-02-01 MED ORDER — SCOPOLAMINE 1 MG/3DAYS TD PT72: 1.0000 | MEDICATED_PATCH | TRANSDERMAL | Status: DC

## 2015-02-01 NOTE — Telephone Encounter (Signed)
Prescription sent

## 2015-02-01 NOTE — Telephone Encounter (Signed)
Patient left a message stating she is going on a cruise and would like motion sickness patch. Please advise.

## 2015-02-01 NOTE — Telephone Encounter (Signed)
Ok for scopolamine transdermal 1 patch every 3 days as needed. Start 4 hours before event. #10

## 2015-04-21 ENCOUNTER — Ambulatory Visit (INDEPENDENT_AMBULATORY_CARE_PROVIDER_SITE_OTHER): Payer: 59 | Admitting: Physician Assistant

## 2015-04-21 ENCOUNTER — Telehealth: Payer: Self-pay

## 2015-04-21 ENCOUNTER — Encounter: Payer: Self-pay | Admitting: Physician Assistant

## 2015-04-21 VITALS — BP 149/86 | HR 82 | Ht 65.0 in | Wt 197.0 lb

## 2015-04-21 DIAGNOSIS — F39 Unspecified mood [affective] disorder: Secondary | ICD-10-CM

## 2015-04-21 DIAGNOSIS — F338 Other recurrent depressive disorders: Secondary | ICD-10-CM | POA: Insufficient documentation

## 2015-04-21 DIAGNOSIS — E785 Hyperlipidemia, unspecified: Secondary | ICD-10-CM | POA: Diagnosis not present

## 2015-04-21 MED ORDER — BUPROPION HCL ER (XL) 150 MG PO TB24: 150.0000 mg | ORAL_TABLET | ORAL | Status: DC

## 2015-04-21 MED ORDER — SIMVASTATIN 40 MG PO TABS: 40.0000 mg | ORAL_TABLET | Freq: Every day | ORAL | Status: DC

## 2015-04-21 NOTE — Telephone Encounter (Signed)
Ok to send [place] only.

## 2015-04-21 NOTE — Telephone Encounter (Signed)
Patient wants brand only for the Wellbutrin. Please advise.

## 2015-04-21 NOTE — Progress Notes (Signed)
   Subjective:    Patient ID: Rebekah Porter, female    DOB: [birth year], 40 y.o.   MRN: [id number]  HPI  Pt presents to the clinic to follow up on depression. It seems the same time of year every year she reaches a point where she has to do something about her depression. Last year she went on celexa for about 3 months. She did great and then she stopped it, because she felt she did not need it. Now she finds herself with no motivation, she wants to sleep all the time, she is not excited about anything, little things get her frustrated. She wants to go on medication again. She wants to go on something that does not cause any potential for weight gain. No suicidal or homicidal thoughts. She is not exercising.     Review of Systems  All other systems reviewed and are negative.      Objective:   Physical Exam  Constitutional: She is oriented to person, place, and time. She appears well-developed and well-nourished.  HENT:  Head: Normocephalic and atraumatic.  Cardiovascular: Normal rate, regular rhythm and normal heart sounds.   Pulmonary/Chest: Effort normal and breath sounds normal.  Neurological: She is alert and oriented to person, place, and time.  Psychiatric: She has a normal mood and affect. Her behavior is normal.          Assessment & Plan:  SAD- PHQ-9 was 18. GAD-7 was 3. Started wellbutrin today. Discussed side effects and giving her 4 weeks to get to therapeutic dose. Follow up in 2 months. Encouraged exercise.   Obese- we need to target weight loss this year. wellbutrin can help. Will discuss at 2 month appt.  Hyperlipidemia- pt not on anything despite elevated cholesterol in labs. Discussed needing a statin. She will start zocor. Recheck lipid in 3-6 months.

## 2015-04-22 MED ORDER — WELLBUTRIN XL 150 MG PO TB24: 150.0000 mg | ORAL_TABLET | ORAL | Status: DC

## 2015-04-22 NOTE — Telephone Encounter (Signed)
Sent brand only.

## 2015-04-23 ENCOUNTER — Encounter: Payer: Self-pay | Admitting: Physician Assistant

## 2015-05-31 ENCOUNTER — Telehealth: Payer: Self-pay | Admitting: *Deleted

## 2015-05-31 NOTE — Telephone Encounter (Signed)
Go ahead and increase to 300mg . Follow up after 4 weeks after starting. She may need a new rx written.

## 2015-05-31 NOTE — Telephone Encounter (Signed)
LMOM notifying pt to increase Wellbutrin to 300mg  & to f/u in one month.

## 2015-05-31 NOTE — Telephone Encounter (Signed)
Pt left a vm stating that she's doing ok overall with the Wellbutrin but feels like it could be increased just a little.  She wanted to know if that could be done now or if she would have to come in.  She was supposed to f/u in 2 months after starting it and it's only been about 6-7 weeks.  Please advise.

## 2015-06-10 ENCOUNTER — Other Ambulatory Visit: Payer: Self-pay

## 2015-06-10 MED ORDER — BUPROPION HCL ER (XL) 300 MG PO TB24: 300.0000 mg | ORAL_TABLET | Freq: Every day | ORAL | Status: DC

## 2015-07-09 ENCOUNTER — Ambulatory Visit (INDEPENDENT_AMBULATORY_CARE_PROVIDER_SITE_OTHER): Payer: 59 | Admitting: Physician Assistant

## 2015-07-09 ENCOUNTER — Encounter: Payer: Self-pay | Admitting: Physician Assistant

## 2015-07-09 VITALS — BP 122/67 | HR 69 | Ht 65.0 in | Wt 207.0 lb

## 2015-07-09 DIAGNOSIS — F4323 Adjustment disorder with mixed anxiety and depressed mood: Secondary | ICD-10-CM | POA: Diagnosis not present

## 2015-07-09 DIAGNOSIS — E669 Obesity, unspecified: Secondary | ICD-10-CM

## 2015-07-09 DIAGNOSIS — Z6831 Body mass index (BMI) 31.0-31.9, adult: Secondary | ICD-10-CM

## 2015-07-09 DIAGNOSIS — E785 Hyperlipidemia, unspecified: Secondary | ICD-10-CM

## 2015-07-09 DIAGNOSIS — F338 Other recurrent depressive disorders: Secondary | ICD-10-CM

## 2015-07-09 DIAGNOSIS — F39 Unspecified mood [affective] disorder: Secondary | ICD-10-CM

## 2015-07-09 MED ORDER — BUPROPION HCL ER (XL) 300 MG PO TB24: 300.0000 mg | ORAL_TABLET | Freq: Every day | ORAL | Status: DC

## 2015-07-09 MED ORDER — LORCASERIN HCL 10 MG PO TABS: ORAL_TABLET | ORAL | Status: DC

## 2015-07-09 NOTE — Progress Notes (Addendum)
   Subjective:    Patient ID: Rebekah Porter, female    DOB: Sep 17, 1975, 40 y.o.   MRN: [id number]  HPI Patient is here for a follow up on her Wellbutrin medication. Patient reports feeling improved with her medication. She states she "no longer feels on edge." She denies complaints or concerns with the medication.    She is interested in starting weight loss medications today. She has tried Phentermine several times in the past and states she had an increased appetite while on the medication. She has also tried Contrave and states it caused her to have had headaches.      Review of Systems  Constitutional: Negative for fatigue.  Respiratory: Negative for shortness of breath and wheezing.   Cardiovascular: Negative for chest pain.  All other systems reviewed and are negative.      Objective:   Physical Exam  Constitutional: She appears well-developed and well-nourished.  HENT:  Head: Normocephalic and atraumatic.  Neck: No tracheal deviation present.  Cardiovascular: Normal rate and regular rhythm.  Exam reveals no gallop and no friction rub.   No murmur heard. Pulmonary/Chest: Effort normal and breath sounds normal. No respiratory distress. She has no wheezes. She has no rales. She exhibits no tenderness.  Skin: Skin is warm and dry.          Assessment & Plan:  1. Seasonal Affective Disorder- GAD7 0. PHQ9 8 today, much improved from last PHQ9 in 04/2015 which was an 18. She is very happy with her medication and the improvements she is seeing. Will refill Wellbutrin for 6 months.   2. Obesity- Weight today 207 lbs. BMI 34.5. Discussed diet and exercise. She is interested in starting weight loss medication at this time. Failed phentermine and contrave. Will start Belviq. Risks and benefits discussed. Follow up in 3 months.   3. Hyperlipidemia- ordered lipid panel and cmp. Continue on zocor. Recheck in 3 months. Continue to work on weight loss.

## 2015-08-24 ENCOUNTER — Telehealth: Payer: Self-pay

## 2015-08-24 MED ORDER — SCOPOLAMINE 1 MG/3DAYS TD PT72: 1.0000 | MEDICATED_PATCH | TRANSDERMAL | Status: DC

## 2015-08-24 NOTE — Telephone Encounter (Signed)
Yes

## 2015-10-13 ENCOUNTER — Ambulatory Visit (INDEPENDENT_AMBULATORY_CARE_PROVIDER_SITE_OTHER): Payer: 59 | Admitting: Obstetrics & Gynecology

## 2015-10-13 DIAGNOSIS — Z1151 Encounter for screening for human papillomavirus (HPV): Secondary | ICD-10-CM

## 2015-10-13 DIAGNOSIS — Z01419 Encounter for gynecological examination (general) (routine) without abnormal findings: Secondary | ICD-10-CM | POA: Diagnosis not present

## 2015-10-13 DIAGNOSIS — Z124 Encounter for screening for malignant neoplasm of cervix: Secondary | ICD-10-CM | POA: Diagnosis not present

## 2015-10-13 NOTE — Progress Notes (Signed)
Subjective:    Rebekah Porter is a 40 y.o. MW P67 (87 yo son)  female who presents for an annual exam. The patient has no complaints today. The patient is sexually active. GYN screening history: last pap: was normal. The patient wears seatbelts: yes. The patient participates in regular exercise: no. Has the patient ever been transfused or tattooed?: yes. The patient reports that there is not domestic violence in her life.   Menstrual History: OB History    No data available      Menarche age: 51 No LMP recorded.    The following portions of the patient's history were reviewed and updated as appropriate: allergies, current medications, past family history, past medical history, past social history, past surgical history and problem list.  Review of Systems Pertinent items noted in HPI and remainder of comprehensive ROS otherwise negative.  Married for 14 years. Denies dyspareunia. She has Essure, placed in 2010 with negative HSG. 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 No FH of breast/colon/gyn cancer.    Objective:    There were no vitals taken for this visit.  General Appearance:    Alert, cooperative, no distress, appears stated age  Head:    Normocephalic, without obvious abnormality, atraumatic  Eyes:    PERRL, conjunctiva/corneas clear, EOM's intact, fundi    benign, both eyes  Ears:    Normal TM's and external ear canals, both ears  Nose:   Nares normal, septum midline, mucosa normal, no drainage    or sinus tenderness  Throat:   Lips, mucosa, and tongue normal; teeth and gums normal  Neck:   Supple, symmetrical, trachea midline, no adenopathy;    thyroid:  no enlargement/tenderness/nodules; no carotid   bruit or JVD  Back:     Symmetric, no curvature, ROM normal, no CVA tenderness  Lungs:     Clear to auscultation bilaterally, respirations unlabored  Chest Wall:    No tenderness or deformity   Heart:    Regular rate and rhythm, S1 and S2 normal, no  murmur, rub   or gallop  Breast Exam:    No tenderness, masses, or nipple abnormality  Abdomen:     Soft, non-tender, bowel sounds active all four quadrants,    no masses, no organomegaly  Genitalia:    Normal female without lesion, discharge or tenderness, Grade 2 rectocele and uterine prolapase, NSSmid plane, NT, no palpable adnexal masses     Extremities:   Extremities normal, atraumatic, no cyanosis or edema  Pulses:   2+ and symmetric all extremities  Skin:   Skin color, texture, turgor normal, no rashes or lesions  Lymph nodes:   Cervical, supraclavicular, and axillary nodes normal  Neurologic:   CNII-XII intact, normal strength, sensation and reflexes    throughout   .    Assessment:    Healthy female exam.    Plan:     Thin prep Pap smear. with cotesting

## 2015-10-14 LAB — GC/CHLAMYDIA PROBE AMP (~~LOC~~) NOT AT ARMC
CHLAMYDIA, DNA PROBE: NEGATIVE
NEISSERIA GONORRHEA: NEGATIVE

## 2015-10-14 LAB — CYTOLOGY - PAP

## 2016-03-21 ENCOUNTER — Ambulatory Visit (INDEPENDENT_AMBULATORY_CARE_PROVIDER_SITE_OTHER): Payer: 59 | Admitting: Obstetrics & Gynecology

## 2016-03-21 ENCOUNTER — Encounter: Payer: Self-pay | Admitting: Obstetrics & Gynecology

## 2016-03-21 VITALS — BP 123/82 | HR 80 | Ht 65.0 in | Wt 202.0 lb

## 2016-03-21 DIAGNOSIS — F339 Major depressive disorder, recurrent, unspecified: Secondary | ICD-10-CM | POA: Diagnosis not present

## 2016-03-21 DIAGNOSIS — Z0000 Encounter for general adult medical examination without abnormal findings: Secondary | ICD-10-CM

## 2016-03-21 DIAGNOSIS — F338 Other recurrent depressive disorders: Secondary | ICD-10-CM

## 2016-03-21 DIAGNOSIS — R6882 Decreased libido: Secondary | ICD-10-CM

## 2016-03-21 NOTE — Progress Notes (Signed)
41 year old female presents complaining of decreased libido. Her and her husband are in marital therapy and decreased libido concern came up during her last session. There is no abuse in the relationship. There was infidelity approximately 5 years ago. The patient states that he has always wanted to have sex more often than she does. He would like to have sex every day. She would like to have sex once a week to twice a week. She struggles to have sex that often. She denies pain with intercourse. She achieves orgasm. She lubricates well. Patient states there are some intimacy issues which are beginning to be addressed during her therapy. She also would like to be close without every encounter leading to intercourse.  Patient denies limited sexual thoughts or fantasy.  She has monthly menstrual cycle. She denies menopausal symptoms.   She does have seasonal affective disorder and just started on Celexa. She's not having problems with anorgasmia. Patient denies heat or cold intolerance. Patient is tired a lot and has difficult motivation. She attributes this to her seasonal affective disorder.  We extensively discussed her relationship for approximately 45 minutes. Marital therapy will be highly beneficial for this couple. If problems do not insurmountable. The need to work on communication and being intimate without intercourse. We talked about testosterone cream and flibanserin.  She does not wish to try either at this time. If she changes her mind she will call back for a prescription. At the conclusion of our talk she believed that marital therapy will be the best form of healing for them. For completeness we will check a TSH and vitamin D level which can lead to lethargy.  45 minutes spent face-to-face with patient with greater than 50% counseling.

## 2016-03-22 ENCOUNTER — Other Ambulatory Visit: Payer: Self-pay | Admitting: Obstetrics & Gynecology

## 2016-03-22 LAB — VITAMIN D 25 HYDROXY (VIT D DEFICIENCY, FRACTURES): Vit D, 25-Hydroxy: 26 ng/mL — ABNORMAL LOW (ref 30–100)

## 2016-03-22 LAB — TSH: TSH: 3 m[IU]/L

## 2016-03-22 NOTE — Progress Notes (Signed)
Pt neds to take 800-100 IU vit D a day.

## 2016-03-23 ENCOUNTER — Telehealth: Payer: Self-pay | Admitting: *Deleted

## 2016-03-23 NOTE — Telephone Encounter (Signed)
Pt notified of normal TSH and Vitamin D level is slightly low, so pt is encouraged to start OTC Vitamin D 800-100 IU daily.  Pt voices understanding

## 2016-07-20 ENCOUNTER — Encounter: Payer: Self-pay | Admitting: Physician Assistant

## 2016-07-20 ENCOUNTER — Ambulatory Visit (INDEPENDENT_AMBULATORY_CARE_PROVIDER_SITE_OTHER): Payer: 59 | Admitting: Physician Assistant

## 2016-07-20 VITALS — BP 115/75 | HR 80 | Ht 65.0 in | Wt 209.0 lb

## 2016-07-20 DIAGNOSIS — F902 Attention-deficit hyperactivity disorder, combined type: Secondary | ICD-10-CM | POA: Insufficient documentation

## 2016-07-20 DIAGNOSIS — Z0000 Encounter for general adult medical examination without abnormal findings: Secondary | ICD-10-CM

## 2016-07-20 DIAGNOSIS — R5383 Other fatigue: Secondary | ICD-10-CM | POA: Diagnosis not present

## 2016-07-20 DIAGNOSIS — E7800 Pure hypercholesterolemia, unspecified: Secondary | ICD-10-CM

## 2016-07-20 DIAGNOSIS — Z131 Encounter for screening for diabetes mellitus: Secondary | ICD-10-CM | POA: Diagnosis not present

## 2016-07-20 DIAGNOSIS — E669 Obesity, unspecified: Secondary | ICD-10-CM | POA: Diagnosis not present

## 2016-07-20 LAB — CBC
HEMATOCRIT: 42.6 % (ref 35.0–45.0)
Hemoglobin: 13.5 g/dL (ref 11.7–15.5)
MCH: 27.3 pg (ref 27.0–33.0)
MCHC: 31.7 g/dL — AB (ref 32.0–36.0)
MCV: 86.1 fL (ref 80.0–100.0)
MPV: 8.8 fL (ref 7.5–12.5)
PLATELETS: 336 10*3/uL (ref 140–400)
RBC: 4.95 MIL/uL (ref 3.80–5.10)
RDW: 13.5 % (ref 11.0–15.0)
WBC: 9.5 10*3/uL (ref 3.8–10.8)

## 2016-07-20 LAB — COMPLETE METABOLIC PANEL WITH GFR
ALBUMIN: 4 g/dL (ref 3.6–5.1)
ALK PHOS: 66 U/L (ref 33–115)
ALT: 25 U/L (ref 6–29)
AST: 24 U/L (ref 10–30)
BILIRUBIN TOTAL: 0.3 mg/dL (ref 0.2–1.2)
BUN: 14 mg/dL (ref 7–25)
CALCIUM: 9.2 mg/dL (ref 8.6–10.2)
CO2: 20 mmol/L (ref 20–31)
CREATININE: 0.9 mg/dL (ref 0.50–1.10)
Chloride: 106 mmol/L (ref 98–110)
GFR, Est Non African American: 81 mL/min (ref >=60)
Glucose, Bld: 81 mg/dL (ref 65–99)
Potassium: 4.6 mmol/L (ref 3.5–5.3)
SODIUM: 140 mmol/L (ref 135–146)
TOTAL PROTEIN: 6.7 g/dL (ref 6.1–8.1)

## 2016-07-20 LAB — LIPID PANEL
CHOL/HDL RATIO: 4.9 ratio (ref <5.0)
Cholesterol: 250 mg/dL — ABNORMAL HIGH (ref <200)
HDL: 51 mg/dL (ref >50)
LDL CALC: 168 mg/dL — AB (ref <100)
Triglycerides: 153 mg/dL — ABNORMAL HIGH (ref <150)
VLDL: 31 mg/dL — ABNORMAL HIGH (ref <30)

## 2016-07-20 LAB — FERRITIN: Ferritin: 20 ng/mL (ref 10–154)

## 2016-07-20 LAB — VITAMIN B12: VITAMIN B 12: 370 pg/mL (ref 200–1100)

## 2016-07-20 MED ORDER — LIRAGLUTIDE -WEIGHT MANAGEMENT 18 MG/3ML ~~LOC~~ SOPN: 0.6000 mg | PEN_INJECTOR | Freq: Every day | SUBCUTANEOUS | 1 refills | Status: DC

## 2016-07-20 NOTE — Patient Instructions (Signed)
Liraglutide injection (Weight Management) What is this medicine? LIRAGLUTIDE (LIR a GLOO tide) is used with a reduced calorie diet and exercise to help you lose weight. This medicine may be used for other purposes; ask your health care provider or pharmacist if you have questions. COMMON BRAND NAME(S): Saxenda What should I tell my health care provider before I take this medicine? They need to know if you have any of these conditions: -endocrine tumors (MEN 2) or if someone in your family had these tumors -gallbladder disease -high cholesterol -history of alcohol abuse problem -history of pancreatitis -kidney disease or if you are on dialysis -liver disease -previous swelling of the tongue, face, or lips with difficulty breathing, difficulty swallowing, hoarseness, or tightening of the throat -stomach problems -suicidal thoughts, plans, or attempt; a previous suicide attempt by you or a family member -thyroid cancer or if someone in your family had thyroid cancer -an unusual or allergic reaction to liraglutide, other medicines, foods, dyes, or preservatives -pregnant or trying to get pregnant -breast-feeding How should I use this medicine? This medicine is for injection under the skin of your upper leg, stomach area, or upper arm. You will be taught how to prepare and give this medicine. Use exactly as directed. Take your medicine at regular intervals. Do not take it more often than directed. It is important that you put your used needles and syringes in a special sharps container. Do not put them in a trash can. If you do not have a sharps container, call your pharmacist or healthcare provider to get one. A special MedGuide will be given to you by the pharmacist with each prescription and refill. Be sure to read this information carefully each time. Talk to your pediatrician regarding the use of this medicine in children. Special care may be needed. Overdosage: If you think you have taken  too much of this medicine contact a poison control center or emergency room at once. NOTE: This medicine is only for you. Do not share this medicine with others. What if I miss a dose? If you miss a dose, take it as soon as you can. If it is almost time for your next dose, take only that dose. Do not take double or extra doses. If you miss your dose for 3 days or more, call your doctor or health care professional to talk about how to restart this medicine. What may interact with this medicine? -insulin and other medicines for diabetes This list may not describe all possible interactions. Give your health care provider a list of all the medicines, herbs, non-prescription drugs, or dietary supplements you use. Also tell them if you smoke, drink alcohol, or use illegal drugs. Some items may interact with your medicine. What should I watch for while using this medicine? Visit your doctor or health care professional for regular checks on your progress. This medicine is intended to be used in addition to a healthy diet and appropriate exercise. The best results are achieved this way. Do not increase or in any way change your dose without consulting your doctor or health care professional. Drink plenty of fluids while taking this medicine. Check with your doctor or health care professional if you get an attack of severe diarrhea, nausea, and vomiting. The loss of too much body fluid can make it dangerous for you to take this medicine. This medicine may affect blood sugar levels. If you have diabetes, check with your doctor or health care professional before you change your diet   or the dose of your diabetic medicine. Patients and their families should watch out for worsening depression or thoughts of suicide. Also watch out for sudden changes in feelings such as feeling anxious, agitated, panicky, irritable, hostile, aggressive, impulsive, severely restless, overly excited and hyperactive, or not being able to  sleep. If this happens, especially at the beginning of treatment or after a change in dose, call your health care professional. What side effects may I notice from receiving this medicine? Side effects that you should report to your doctor or health care professional as soon as possible: -allergic reactions like skin rash, itching or hives, swelling of the face, lips, or tongue -breathing problems -diarrhea that continues or is severe -lump or swelling on the neck -severe nausea -signs and symptoms of infection like fever or chills; cough; sore throat; pain or trouble passing urine -signs and symptoms of low blood sugar such as feeling anxious, confusion, dizziness, increased hunger, unusually weak or tired, sweating, shakiness, cold, irritable, headache, blurred vision, fast heartbeat, loss of consciousness -signs and symptoms of kidney injury like trouble passing urine or change in the amount of urine -trouble swallowing -unusual stomach upset or pain -vomiting Side effects that usually do not require medical attention (report to your doctor or health care professional if they continue or are bothersome): -constipation -decreased appetite -diarrhea -fatigue -headache -nausea -pain, redness, or irritation at site where injected -stomach upset -stuffy or runny nose This list may not describe all possible side effects. Call your doctor for medical advice about side effects. You may report side effects to FDA at 1-800-FDA-1088. Where should I keep my medicine? Keep out of the reach of children. Store unopened pen in a refrigerator between 2 and 8 degrees C (36 and 46 degrees F). Do not freeze or use if the medicine has been frozen. Protect from light and excessive heat. After you first use the pen, it can be stored at room temperature between 15 and 30 degrees C (59 and 86 degrees F) or in a refrigerator. Throw away your used pen after 30 days or after the expiration date, whichever comes  first. Do not store your pen with the needle attached. If the needle is left on, medicine may leak from the pen. NOTE: This sheet is a summary. It may not cover all possible information. If you have questions about this medicine, talk to your doctor, pharmacist, or health care provider.  2018 Elsevier/Gold Standard (2016-03-16 14:41:37)  

## 2016-07-20 NOTE — Progress Notes (Signed)
Subjective:     Rebekah Porter is a 41 y.o. female and is here for a comprehensive physical exam. The patient reports she is seeing [facility] for [facility] and trying different medications to see if will last through the whole day. she admits to no energy and lack of motivation. she does feel depressed. her weight continues to rise. she is not eating healthy or exercising. she wants this year to get things together. .   Social History   Social History  . Marital status: Married    Spouse name: N/A  . Number of children: N/A  . Years of education: N/A   Occupational History  . Not on file.   Social History Main Topics  . Smoking status: Never Smoker  . Smokeless tobacco: Never Used  . Alcohol use No  . Drug use: No  . Sexual activity: Yes    Birth control/ protection: Surgical   Other Topics Concern  . Not on file   Social History Narrative  . No narrative on file   Health Maintenance  Topic Date Due  . HIV Screening  11/28/1991  . TETANUS/TDAP  03/13/2016  . INFLUENZA VACCINE  10/11/2016  . PAP SMEAR  10/13/2018    The following portions of the patient's history were reviewed and updated as appropriate: allergies, current medications, past family history, past medical history, past social history, past surgical history and problem list.  Review of Systems Pertinent items noted in HPI and remainder of comprehensive ROS otherwise negative.   Objective:    BP 115/75   Pulse 80   Ht 5\' 5"  (1.651 m)   Wt 209 lb (94.8 kg)   BMI 34.78 kg/m  General appearance: alert, cooperative, appears stated age and mildly obese Head: Normocephalic, without obvious abnormality, atraumatic Eyes: conjunctivae/corneas clear. PERRL, EOM's intact. Fundi benign. Ears: normal TM's and external ear canals both ears Nose: Nares normal. Septum midline. Mucosa normal. No drainage or sinus tenderness. Throat: lips, mucosa, and tongue normal; teeth and gums normal Neck: no adenopathy,  no carotid bruit, no JVD, supple, symmetrical, trachea midline and thyroid not enlarged, symmetric, no tenderness/mass/nodules Back: symmetric, no curvature. ROM normal. No CVA tenderness. Lungs: clear to auscultation bilaterally Heart: regular rate and rhythm, S1, S2 normal, no murmur, click, rub or gallop Abdomen: soft, non-tender; bowel sounds normal; no masses,  no organomegaly Extremities: extremities normal, atraumatic, no cyanosis or edema Pulses: 2+ and symmetric Skin: Skin color, texture, turgor normal. No rashes or lesions Lymph nodes: Cervical, supraclavicular, and axillary nodes normal. Neurologic: Alert and oriented X 3, normal strength and tone. Normal symmetric reflexes. Normal coordination and gait    Assessment:    Healthy female exam.     Plan:    Marland Kitchen.Marland Kitchen.Estephani was seen today for annual exam.  Diagnoses and all orders for this visit:  Routine general medical examination at a health care facility -     Lipid panel -     COMPLETE METABOLIC PANEL WITH GFR -     CBC -     Ferritin -     Vitamin B12  ADHD (attention deficit hyperactivity disorder), combined type  No energy -     CBC -     Ferritin -     Vitamin B12  Pure hypercholesterolemia -     Lipid panel  Screening for diabetes mellitus -     COMPLETE METABOLIC PANEL WITH GFR  Obesity (BMI 30-39.9) -     Liraglutide -Weight Management (  SAXENDA) 18 MG/3ML SOPN; Inject 0.6 mg into the skin daily. For one week then increase by .6mg  weekly until reaches 3mg  daily.  Please include ultra fine needles 6mm   Pap and mammogram screening managed by GYN.   Labs ordered.   Follow up with [facility] on ADHD as well as I do think there is a depression component and to consider viibryd/trintellix.   Weight: start saxenda. She has tried belviq and failed. Phentermine not an option due to already being on a stimulant. Discussed side effects. Discussed taper up. Follow up in 2 months.  See After Visit  Summary for Counseling Recommendations

## 2016-07-24 ENCOUNTER — Other Ambulatory Visit: Payer: Self-pay | Admitting: *Deleted

## 2016-07-24 MED ORDER — SIMVASTATIN 40 MG PO TABS: 40.0000 mg | ORAL_TABLET | Freq: Every day | ORAL | 5 refills | Status: DC

## 2016-07-24 NOTE — Progress Notes (Signed)
Call pt: iron stores low but you are not anemic. Increase iron rich foods.  B12 low normal. Start b12 1000mcg daily.  LDL improved some are you taking zocor?  TG increased need to be on fish oill 4000mg  daily.

## 2016-10-23 ENCOUNTER — Telehealth: Payer: Self-pay

## 2016-10-23 ENCOUNTER — Other Ambulatory Visit: Payer: Self-pay | Admitting: Physician Assistant

## 2016-10-23 DIAGNOSIS — E7800 Pure hypercholesterolemia, unspecified: Secondary | ICD-10-CM

## 2016-10-23 DIAGNOSIS — E669 Obesity, unspecified: Secondary | ICD-10-CM

## 2016-10-23 NOTE — Telephone Encounter (Signed)
Pt is requesting a nutrition referral.  She stated that she discussed it with you in May, but has not followed up regarding.  Please advise.

## 2016-10-23 NOTE — Telephone Encounter (Signed)
Placed referral. Let us know if do not hear about approval or appt in next week.

## 2016-10-23 NOTE — Telephone Encounter (Incomplete)
Unfortunately because you do not have diabetes insurance will not pay

## 2016-10-25 NOTE — Telephone Encounter (Signed)
Left message advising of recommendations.  

## 2016-11-29 ENCOUNTER — Telehealth: Payer: Self-pay | Admitting: Physician Assistant

## 2016-11-29 DIAGNOSIS — Z1239 Encounter for other screening for malignant neoplasm of breast: Secondary | ICD-10-CM

## 2016-11-29 NOTE — Telephone Encounter (Signed)
Patient called request a to get a mammogram order sent to imaging. Thanks

## 2016-11-29 NOTE — Telephone Encounter (Signed)
Ok to send order for tomo screening bilateral mammogram.

## 2016-11-30 NOTE — Addendum Note (Signed)
Addended by: Wyline Beady on: 11/30/2016 01:54 PM   Modules accepted: Orders

## 2016-12-11 ENCOUNTER — Other Ambulatory Visit: Payer: Self-pay | Admitting: Physician Assistant

## 2016-12-13 ENCOUNTER — Ambulatory Visit (INDEPENDENT_AMBULATORY_CARE_PROVIDER_SITE_OTHER): Payer: 59

## 2016-12-13 DIAGNOSIS — Z1231 Encounter for screening mammogram for malignant neoplasm of breast: Secondary | ICD-10-CM

## 2017-03-01 ENCOUNTER — Encounter (INDEPENDENT_AMBULATORY_CARE_PROVIDER_SITE_OTHER): Payer: Self-pay

## 2017-09-12 ENCOUNTER — Encounter: Payer: Self-pay | Admitting: Physician Assistant

## 2017-09-12 ENCOUNTER — Ambulatory Visit (INDEPENDENT_AMBULATORY_CARE_PROVIDER_SITE_OTHER): Payer: 59 | Admitting: Physician Assistant

## 2017-09-12 VITALS — BP 125/85 | HR 66 | Temp 98.1°F | Wt 221.0 lb

## 2017-09-12 DIAGNOSIS — N3001 Acute cystitis with hematuria: Secondary | ICD-10-CM | POA: Diagnosis not present

## 2017-09-12 DIAGNOSIS — Z9851 Tubal ligation status: Secondary | ICD-10-CM | POA: Diagnosis not present

## 2017-09-12 LAB — POCT URINALYSIS DIPSTICK
Bilirubin, UA: NEGATIVE
Glucose, UA: NEGATIVE
KETONES UA: NEGATIVE
NITRITE UA: POSITIVE
PROTEIN UA: POSITIVE — AB
Spec Grav, UA: >=1.03 — AB (ref 1.010–1.025)
Urobilinogen, UA: 0.2 E.U./dL
pH, UA: 6.5 (ref 5.0–8.0)

## 2017-09-12 MED ORDER — NITROFURANTOIN MONOHYD MACRO 100 MG PO CAPS: 100.0000 mg | ORAL_CAPSULE | Freq: Two times a day (BID) | ORAL | 0 refills | Status: AC

## 2017-09-12 MED ORDER — PHENAZOPYRIDINE HCL 200 MG PO TABS: 200.0000 mg | ORAL_TABLET | Freq: Three times a day (TID) | ORAL | 0 refills | Status: DC | PRN

## 2017-09-12 NOTE — Patient Instructions (Signed)

## 2017-09-12 NOTE — Progress Notes (Signed)
HPI:                                                                Rebekah Porter is a 42 y.o. female who presents to [facility] today for dysuria  Dysuria   This is a new problem. The current episode started in the past 7 days (x 3 days). The problem has been unchanged. The quality of the pain is described as burning. The pain is moderate. There has been no fever. She is sexually active. There is no history of pyelonephritis. Associated symptoms include frequency, hematuria, hesitancy and urgency. Pertinent negatives include no chills, flank pain, nausea, possible pregnancy or vomiting. She has tried increased fluids for the symptoms. The treatment provided no relief. There is no history of kidney stones or recurrent UTIs.      Depression screen [facility] 2/9 09/12/2017 07/20/2016 05/11/2013  Decreased Interest 0 1 0  Down, Depressed, Hopeless 0 0 0  PHQ - 2 Score 0 1 0    No flowsheet data found.    Past Medical History:  Diagnosis Date  . Obesity    Past Surgical History:  Procedure Laterality Date  . ESSURE TUBAL LIGATION     Social History   Tobacco Use  . Smoking status: Never Smoker  . Smokeless tobacco: Never Used  Substance Use Topics  . Alcohol use: No   family history includes Alcohol abuse in her father; Asthma in her mother; Diabetes in her paternal grandmother; Hypertension in her paternal grandmother.    ROS: negative except as noted in the HPI  Medications: Current Outpatient Medications  Medication Sig Dispense Refill  . modafinil (PROVIGIL) 200 MG tablet Take 200 mg by mouth every morning.  3  . VIIBRYD 40 MG TABS 1 tablet daily.  2  . nitrofurantoin, macrocrystal-monohydrate, (MACROBID) 100 MG capsule Take 1 capsule (100 mg total) by mouth 2 (two) times daily for 5 days. 10 capsule 0  . phenazopyridine (PYRIDIUM) 200 MG tablet Take 1 tablet (200 mg total) by mouth 3 (three) times daily as needed for pain.  10 tablet 0   No current facility-administered medications for this visit.    Allergies  Allergen Reactions  . Contrave [Naltrexone-Bupropion Hcl Er] Other (See Comments)    Starving. Did not help.        Objective:  BP 125/85   Pulse 66   Temp 98.1 F (36.7 C) (Oral)   Wt 221 lb (100.2 kg)   LMP 08/24/2017 Comment: Essure  BMI 36.78 kg/m  Gen:  alert, not ill-appearing, no distress, appropriate for age, obese female HEENT: head normocephalic without obvious abnormality, conjunctiva and cornea clear, trachea midline Pulm: Normal work of breathing, normal phonation GI: abdomen soft, suprapubic pressure, no CVA tenderness Neuro: alert and oriented x 3, no tremor MSK: extremities atraumatic, normal gait and station Skin: intact, no rashes on exposed skin, no jaundice, no cyanosis    Results for orders placed or performed in visit on 09/12/17 (from the past 72 hour(s))  POCT Urinalysis Dipstick     Status: Abnormal   Collection Time: 09/12/17 10:31 AM  Result Value Ref Range   Color, UA yellow    Clarity, UA cloudy    Glucose, UA  Negative Negative   Bilirubin, UA negative    Ketones, UA negative    Spec Grav, UA >=1.030 (A) 1.010 - 1.025   Blood, UA moderate    pH, UA 6.5 5.0 - 8.0   Protein, UA Positive (A) Negative   Urobilinogen, UA 0.2 0.2 or 1.0 E.U./dL   Nitrite, UA positive    Leukocytes, UA Moderate (2+) (A) Negative   Appearance     Odor     No results found.    Assessment and Plan: 42 y.o. female with   Acute cystitis with hematuria - Plan: POCT Urinalysis Dipstick, Urine Culture, nitrofurantoin, macrocrystal-monohydrate, (MACROBID) 100 MG capsule, phenazopyridine (PYRIDIUM) 200 MG tablet  Status post tubal ligation  UA positive for mod blood, nitrites and mod leuks. No evidence of pyelonephritis. Treating empirically for uncomplicated UTI with Macrobid bid x 5 days. Pyridium as needed for dysuria. Urine culture pending   Patient education and  anticipatory guidance given Patient agrees with treatment plan Follow-up as needed if symptoms worsen or fail to improve  Levonne Hubertharley E. Cummings PA-C

## 2017-09-14 ENCOUNTER — Encounter: Payer: Self-pay | Admitting: Physician Assistant

## 2017-09-14 ENCOUNTER — Ambulatory Visit (INDEPENDENT_AMBULATORY_CARE_PROVIDER_SITE_OTHER): Payer: 59 | Admitting: Physician Assistant

## 2017-09-14 VITALS — BP 125/82 | HR 65 | Ht 65.0 in | Wt 218.0 lb

## 2017-09-14 DIAGNOSIS — Z131 Encounter for screening for diabetes mellitus: Secondary | ICD-10-CM | POA: Diagnosis not present

## 2017-09-14 DIAGNOSIS — R5383 Other fatigue: Secondary | ICD-10-CM

## 2017-09-14 DIAGNOSIS — Z13220 Encounter for screening for lipoid disorders: Secondary | ICD-10-CM

## 2017-09-14 DIAGNOSIS — Z0000 Encounter for general adult medical examination without abnormal findings: Secondary | ICD-10-CM | POA: Diagnosis not present

## 2017-09-14 DIAGNOSIS — E669 Obesity, unspecified: Secondary | ICD-10-CM

## 2017-09-14 LAB — URINE CULTURE
MICRO NUMBER: 90795931
SPECIMEN QUALITY:: ADEQUATE

## 2017-09-14 MED ORDER — PHENTERMINE-TOPIRAMATE ER 7.5-46 MG PO CP24: 1.0000 | ORAL_CAPSULE | Freq: Every morning | ORAL | 0 refills | Status: DC

## 2017-09-14 MED ORDER — PHENTERMINE-TOPIRAMATE ER 3.75-23 MG PO CP24: 1.0000 | ORAL_CAPSULE | Freq: Every morning | ORAL | 0 refills | Status: DC

## 2017-09-14 NOTE — Progress Notes (Signed)
Subjective:     Rebekah Porter is a 42 y.o. female and is here for a comprehensive physical exam. The patient reports problems - continues to struggle with her weight. not exercising or eating right.  she has no energy. She is sleeping ok at night. Unaware if she snores. saxenda too expensive. Phentermine tried and just gained weight back. belviq did not seem to work. contrave side effects.   Social History   Socioeconomic History  . Marital status: Married    Spouse name: Not on file  . Number of children: Not on file  . Years of education: Not on file  . Highest education level: Not on file  Occupational History  . Not on file  Social Needs  . Financial resource strain: Not on file  . Food insecurity:    Worry: Not on file    Inability: Not on file  . Transportation needs:    Medical: Not on file    Non-medical: Not on file  Tobacco Use  . Smoking status: Never Smoker  . Smokeless tobacco: Never Used  Substance and Sexual Activity  . Alcohol use: No  . Drug use: No  . Sexual activity: Yes    Birth control/protection: Surgical  Lifestyle  . Physical activity:    Days per week: Not on file    Minutes per session: Not on file  . Stress: Not on file  Relationships  . Social connections:    Talks on phone: Not on file    Gets together: Not on file    Attends religious service: Not on file    Active member of club or organization: Not on file    Attends meetings of clubs or organizations: Not on file    Relationship status: Not on file  . Intimate partner violence:    Fear of current or ex partner: Not on file    Emotionally abused: Not on file    Physically abused: Not on file    Forced sexual activity: Not on file  Other Topics Concern  . Not on file  Social History Narrative  . Not on file   Health Maintenance  Topic Date Due  . HIV Screening  11/28/1991  . TETANUS/TDAP  03/13/2016  . INFLUENZA VACCINE  10/11/2017  . PAP SMEAR  10/13/2018    The following  portions of the patient's history were reviewed and updated as appropriate: allergies, current medications, past family history, past medical history, past social history, past surgical history and problem list.  Review of Systems Pertinent items noted in HPI and remainder of comprehensive ROS otherwise negative.   Objective:    BP 125/82   Pulse 65   Ht 5\' 5"  (1.651 m)   Wt 218 lb (98.9 kg)   LMP 08/24/2017 Comment: Essure  SpO2 100%   BMI 36.28 kg/m  General appearance: alert, cooperative, appears stated age and mildly obese Head: Normocephalic, without obvious abnormality, atraumatic Eyes: conjunctivae/corneas clear. PERRL, EOM's intact. Fundi benign. Ears: normal TM's and external ear canals both ears Nose: Nares normal. Septum midline. Mucosa normal. No drainage or sinus tenderness. Throat: lips, mucosa, and tongue normal; teeth and gums normal Neck: no adenopathy, no carotid bruit, no JVD, supple, symmetrical, trachea midline and thyroid not enlarged, symmetric, no tenderness/mass/nodules Back: symmetric, no curvature. ROM normal. No CVA tenderness. Lungs: clear to auscultation bilaterally Heart: regular rate and rhythm, S1, S2 normal, no murmur, click, rub or gallop Abdomen: soft, non-tender; bowel sounds normal; no masses,  no organomegaly Extremities: extremities normal, atraumatic, no cyanosis or edema Pulses: 2+ and symmetric Skin: Skin color, texture, turgor normal. No rashes or lesions Lymph nodes: Cervical, supraclavicular, and axillary nodes normal. Neurologic: Alert and oriented X 3, normal strength and tone. Normal symmetric reflexes. Normal coordination and gait    Assessment:    Healthy female exam.      Plan:    Marland Kitchen.Marland Kitchen.[name] was seen today for annual exam.  Diagnoses and all orders for this visit:  Routine physical examination -     Lipid Panel w/reflex Direct LDL -     COMPLETE METABOLIC PANEL WITH GFR -     CBC with Differential/Platelet -     B12 -      VITAMIN D 25 Hydroxy (Vit-D Deficiency, Fractures) -     Ferritin  Obesity (BMI 30-39.9) -     Phentermine-Topiramate 3.75-23 MG CP24; Take 1 tablet by mouth every morning. -     Phentermine-Topiramate 7.5-46 MG CP24; Take 1 tablet by mouth every morning.  Screening for lipid disorders -     Lipid Panel w/reflex Direct LDL  Screening for diabetes mellitus -     COMPLETE METABOLIC PANEL WITH GFR  No energy -     CBC with Differential/Platelet -     B12 -     VITAMIN D 25 Hydroxy (Vit-D Deficiency, Fractures) -     Ferritin -     TSH  .Marland Kitchen. Depression screen [facility] 2/9 09/12/2017 07/20/2016 05/11/2013  Decreased Interest 0 1 0  Down, Depressed, Hopeless 0 0 0  PHQ - 2 Score 0 1 0   .Marland Kitchen. Discussed 150 minutes of exercise a week.  Encouraged vitamin D 1000 units and Calcium 1300mg  or 4 servings of dairy a day.  Screening labs ordered.  Mammogram done last year. Up to date.   Declined Tdap today but agreed to get it at weight follow up.   Labs done to evaluate energy.   Marland Kitchen..Discussed low carb diet with 1500 calories and 80g of protein.  Exercising at least 150 minutes a week.  My Fitness Pal could be a [employer] resource.  Tried contrave and belviq and phentermine. saxenda too expensive.  qsymia ordered. Follow up in 6 weeks.    See After Visit Summary for Counseling Recommendations

## 2017-09-14 NOTE — Patient Instructions (Signed)

## 2017-10-09 ENCOUNTER — Encounter: Payer: Self-pay | Admitting: Obstetrics & Gynecology

## 2017-10-09 ENCOUNTER — Ambulatory Visit (INDEPENDENT_AMBULATORY_CARE_PROVIDER_SITE_OTHER): Payer: 59 | Admitting: Obstetrics & Gynecology

## 2017-10-09 VITALS — BP 125/83 | HR 88 | Ht 65.0 in | Wt 218.0 lb

## 2017-10-09 DIAGNOSIS — F5222 Female sexual arousal disorder: Secondary | ICD-10-CM | POA: Diagnosis not present

## 2017-10-09 MED ORDER — FLIBANSERIN 100 MG PO TABS: 1.0000 | ORAL_TABLET | Freq: Every day | ORAL | 1 refills | Status: DC

## 2017-10-09 NOTE — Progress Notes (Signed)
Last pap- 10/13/15- normal  Last mammogram- 12/13/16- normal  Pt states she started her period yesterday and it is heavy so she only wants breast exam. PT c/o decreased libido and would like to try Addyi

## 2017-10-09 NOTE — Progress Notes (Signed)
Subjective:     Rebekah Porter is a 42 y.o. female here for a routine exam.  Current complaints: patient still has decreased libido.  Pt and husband went to marital therapy but did not talk about sexual issues much.  There still seems to be unresolved issues between them.  They went to a religious counselor.  Pt feels like it helped her some but there is much more to do.  Pt wants to try addyi to help with female libido issues.  Pt understands it make improve some but will not be a "magic bullet" and that counseling, feeling emotionally close to her husband, being able to communicate, and discussing sexual interest or lack there of is essential to a healthy intimate relationship.   Gynecologic History Patient's last menstrual period was 10/08/2017. Contraception: tubal ligation Last Pap: 2017. Results were: normal Last mammogram: 2018. Results were: normal  Obstetric History OB History  Gravida Para Term Preterm AB Living  1 1 1     1   SAB TAB Ectopic Multiple Live Births               # Outcome Date GA Lbr Len/2nd Weight Sex Delivery Anes PTL Lv  1 Term              The following portions of the patient's history were reviewed and updated as appropriate: allergies, current medications, past family history, past medical history, past social history, past surgical history and problem list.  Review of Systems Pertinent items noted in HPI and remainder of comprehensive ROS otherwise negative.    Objective:      Vitals:   10/09/17 1403  BP: 125/83  Pulse: 88  Weight: 218 lb (98.9 kg)  Height: 5\' 5"  (1.651 m)   Vitals:  WNL General appearance: alert, cooperative and no distress  HEENT: Normocephalic, without obvious abnormality, atraumatic Eyes: negative Throat: lips, mucosa, and tongue normal; teeth and gums normal  Respiratory: Normal effort  CV: Regular rate and rhythm  Breasts:  Normal appearance, no masses or tenderness, no nipple retraction or dimpling  GI: Soft,  non-tender; bowel sounds normal; no masses,  no organomegaly  GU: Pt on menses and wants to defer exam  Vagina: Pt on menses and wants to defer exam  Cervix: Pt on menses and wants to defer exam  Uterus:  Pt on menses and wants to defer exam  Adnexa: Pt on menses and wants to defer exam  Musculoskeletal: No edema, redness or tenderness in the calves or thighs  Skin: No lesions or rash  Lymphatic: Will examine on next visit  Psychiatric: Normal mood and behavior        Assessment:    Healthy female exam.   Hypoarousal disorder   Plan:    Sex counseling/maritial counseling ([facility]) Addyi daily--warning about alcohol and hypotension RTC 8 weeks.  40 minutes spent face to face with patient with >50% counseling.

## 2017-10-24 ENCOUNTER — Ambulatory Visit: Payer: 59 | Admitting: Physician Assistant

## 2017-11-27 ENCOUNTER — Other Ambulatory Visit: Payer: Self-pay | Admitting: Physician Assistant

## 2017-11-27 DIAGNOSIS — Z1231 Encounter for screening mammogram for malignant neoplasm of breast: Secondary | ICD-10-CM

## 2017-11-28 ENCOUNTER — Ambulatory Visit: Payer: 59 | Admitting: Physician Assistant

## 2017-12-20 ENCOUNTER — Ambulatory Visit: Payer: 59

## 2017-12-20 ENCOUNTER — Ambulatory Visit (INDEPENDENT_AMBULATORY_CARE_PROVIDER_SITE_OTHER): Payer: 59

## 2017-12-20 DIAGNOSIS — Z1231 Encounter for screening mammogram for malignant neoplasm of breast: Secondary | ICD-10-CM

## 2017-12-21 NOTE — Progress Notes (Signed)
Call pt: normal mammogram. Follow up in 1 year.

## 2017-12-31 ENCOUNTER — Ambulatory Visit: Payer: 59 | Admitting: Obstetrics & Gynecology

## 2017-12-31 ENCOUNTER — Telehealth: Payer: Self-pay | Admitting: *Deleted

## 2017-12-31 DIAGNOSIS — Z09 Encounter for follow-up examination after completed treatment for conditions other than malignant neoplasm: Secondary | ICD-10-CM

## 2017-12-31 NOTE — Telephone Encounter (Signed)
Left message for patient to call and reschedule appt from 12/31/17 at 8:45am. Told patient that she is subject to a NO SHOW FEE of $50.00.

## 2018-02-25 ENCOUNTER — Other Ambulatory Visit: Payer: Self-pay | Admitting: Physician Assistant

## 2018-02-26 NOTE — Telephone Encounter (Signed)
Call pt to see if she actually needs refill?

## 2018-02-27 ENCOUNTER — Other Ambulatory Visit: Payer: Self-pay

## 2018-02-27 NOTE — Telephone Encounter (Signed)
Pt called stating she is going on vacation and needs a RF on motion sickness patches.   RX has not been sent since 2018.Marland Kitchen.  RX pended, please send if appropriate

## 2018-02-28 MED ORDER — SCOPOLAMINE 1 MG/3DAYS TD PT72: 1.0000 | MEDICATED_PATCH | TRANSDERMAL | 0 refills | Status: DC

## 2018-02-28 NOTE — Telephone Encounter (Signed)
Left pt msg advising RX sent in  

## 2018-03-07 NOTE — Telephone Encounter (Signed)
Called patient and mailbox is full and could not leave message. KG LPN

## 2018-05-06 ENCOUNTER — Telehealth: Payer: Self-pay | Admitting: Physician Assistant

## 2018-05-06 DIAGNOSIS — Z0000 Encounter for general adult medical examination without abnormal findings: Secondary | ICD-10-CM

## 2018-05-06 DIAGNOSIS — E669 Obesity, unspecified: Secondary | ICD-10-CM

## 2018-05-06 DIAGNOSIS — E7800 Pure hypercholesterolemia, unspecified: Secondary | ICD-10-CM

## 2018-05-06 DIAGNOSIS — R5383 Other fatigue: Secondary | ICD-10-CM

## 2018-05-06 NOTE — Telephone Encounter (Signed)
Labs re-ordered and faxed.  Left pt msg on ID'd VM to let her know.

## 2018-05-06 NOTE — Telephone Encounter (Signed)
Will need to be reprinted and faxed downstairs.

## 2018-05-06 NOTE — Telephone Encounter (Signed)
Patient wanted to know if labs from last years physical are still good. If they are she wanted to come pick them up and do them. She did not need a physical at this time. Please advise.

## 2018-11-12 LAB — HM PAP SMEAR

## 2019-01-22 LAB — HM MAMMOGRAPHY

## 2019-02-03 ENCOUNTER — Encounter: Payer: Self-pay | Admitting: Physician Assistant

## 2019-02-20 ENCOUNTER — Encounter: Payer: Self-pay | Admitting: Physician Assistant

## 2020-01-28 LAB — HM MAMMOGRAPHY

## 2020-04-20 ENCOUNTER — Ambulatory Visit (INDEPENDENT_AMBULATORY_CARE_PROVIDER_SITE_OTHER): Payer: 59

## 2020-04-20 ENCOUNTER — Other Ambulatory Visit: Payer: Self-pay

## 2020-04-20 ENCOUNTER — Ambulatory Visit (INDEPENDENT_AMBULATORY_CARE_PROVIDER_SITE_OTHER): Payer: 59 | Admitting: Physician Assistant

## 2020-04-20 ENCOUNTER — Encounter: Payer: Self-pay | Admitting: Physician Assistant

## 2020-04-20 VITALS — BP 121/72 | HR 83 | Temp 98.8°F | Wt 198.0 lb

## 2020-04-20 DIAGNOSIS — U071 COVID-19: Secondary | ICD-10-CM | POA: Diagnosis not present

## 2020-04-20 DIAGNOSIS — R0602 Shortness of breath: Secondary | ICD-10-CM

## 2020-04-20 DIAGNOSIS — R059 Cough, unspecified: Secondary | ICD-10-CM | POA: Diagnosis not present

## 2020-04-20 MED ORDER — DEXAMETHASONE 4 MG PO TABS: 4.0000 mg | ORAL_TABLET | Freq: Two times a day (BID) | ORAL | 0 refills | Status: DC

## 2020-04-20 NOTE — Progress Notes (Signed)
Subjective:    Patient ID: Rebekah Porter, female    DOB: Feb 25, 1976, 45 y.o.   MRN: [id number]  HPI  Pt is a 45 yo obese female with HLD who presents to the clinic at Day 15 of covid and some concerns. She has not sought any medication help and been treating herself at home with conservative care. She continues to have dry cough, SOB, fatigue, brain fog. She denies any lower leg swelling, pain, redness, warmth or tenderness. She denies any CP or palpitations. Her SOB is worse in afternoons and evenings. No ongoing fever, chills or body aches. She found out people in her family have had blood clots and this made her worry.   .. Active Ambulatory Problems    Diagnosis Date Noted  . RHINITIS 08/04/2008  . WEIGHT GAIN 01/12/2009  . Routine general medical examination at a health care facility 06/06/2010  . Obesity (BMI 30-39.9) 05/09/2013  . Inattention 08/18/2013  . Adjustment disorder with mixed anxiety and depressed mood 06/05/2014  . Seasonal affective disorder (HCC) 04/21/2015  . Hyperlipidemia 04/21/2015  . ADHD (attention deficit hyperactivity disorder), combined type 07/20/2016  . Status post tubal ligation 09/12/2017   Resolved Ambulatory Problems    Diagnosis Date Noted  . No Resolved Ambulatory Problems   Past Medical History:  Diagnosis Date  . Obesity      Review of Systems    see HPI.  Objective:   Physical Exam Vitals reviewed.  Constitutional:      Appearance: Normal appearance. She is obese.  Cardiovascular:     Rate and Rhythm: Normal rate and regular rhythm.     Pulses: Normal pulses.  Pulmonary:     Effort: Pulmonary effort is normal.     Breath sounds: Normal breath sounds.  Musculoskeletal:     Cervical back: No tenderness.     Right lower leg: No edema.     Left lower leg: No edema.     Comments: No bilateral leg swelling, tenderness, warmth, redness, pain.  Negative homans sign.   Lymphadenopathy:     Cervical: No cervical adenopathy.   Neurological:     General: No focal deficit present.     Mental Status: She is alert and oriented to person, place, and time.  Psychiatric:        Mood and Affect: Mood normal.           Assessment & Plan:  Marland KitchenMarland KitchenLoumay was seen today for covid positive.  Diagnoses and all orders for this visit:  COVID-19 virus infection -     dexamethasone (DECADRON) 4 MG tablet; Take 1 tablet (4 mg total) by mouth 2 (two) times daily with a meal. -     DG Chest 2 View -     COMPLETE METABOLIC PANEL WITH GFR  Cough -     dexamethasone (DECADRON) 4 MG tablet; Take 1 tablet (4 mg total) by mouth 2 (two) times daily with a meal. -     DG Chest 2 View -     COMPLETE METABOLIC PANEL WITH GFR  Shortness of breath -     dexamethasone (DECADRON) 4 MG tablet; Take 1 tablet (4 mg total) by mouth 2 (two) times daily with a meal. -     DG Chest 2 View -     COMPLETE METABOLIC PANEL WITH GFR   Reassured pt that vitals look great. Physical exam was unremarkable. No high risk concerns for blood clot or PE. Check CMP. Will get  CXR. Start dexadron. Ok to use albuterol that is at home. Discussed red flag symptoms of PE and worsening breathing. She has pulse ox at home. Discussed can take quite some time for all covid symptoms to resolve. Continue to stay hydrated and stay on vitamin D, C, and zinc.

## 2020-04-20 NOTE — Progress Notes (Signed)
Rebekah Porter,   CXR looks great. No acute changes.

## 2020-04-21 LAB — COMPLETE METABOLIC PANEL WITH GFR
AG Ratio: 1.6 (calc) (ref 1.0–2.5)
ALT: 17 U/L (ref 6–29)
AST: 17 U/L (ref 10–30)
Albumin: 3.8 g/dL (ref 3.6–5.1)
Alkaline phosphatase (APISO): 83 U/L (ref 31–125)
BUN: 13 mg/dL (ref 7–25)
CO2: 27 mmol/L (ref 20–32)
Calcium: 9 mg/dL (ref 8.6–10.2)
Chloride: 104 mmol/L (ref 98–110)
Creat: 0.68 mg/dL (ref 0.50–1.10)
GFR, Est African American: 124 mL/min/{1.73_m2} (ref >=60)
GFR, Est Non African American: 107 mL/min/{1.73_m2} (ref >=60)
Globulin: 2.4 g/dL (calc) (ref 1.9–3.7)
Glucose, Bld: 98 mg/dL (ref 65–99)
Potassium: 4.2 mmol/L (ref 3.5–5.3)
Sodium: 140 mmol/L (ref 135–146)
Total Bilirubin: 0.4 mg/dL (ref 0.2–1.2)
Total Protein: 6.2 g/dL (ref 6.1–8.1)

## 2020-04-21 NOTE — Progress Notes (Signed)
Kidney, liver and electrolytes look GREAT.

## 2020-05-20 ENCOUNTER — Encounter: Payer: Self-pay | Admitting: Physician Assistant

## 2020-07-28 ENCOUNTER — Encounter: Payer: Self-pay | Admitting: Physician Assistant

## 2020-07-30 ENCOUNTER — Encounter: Payer: Self-pay | Admitting: Physician Assistant

## 2020-07-30 ENCOUNTER — Telehealth (INDEPENDENT_AMBULATORY_CARE_PROVIDER_SITE_OTHER): Payer: 59 | Admitting: Physician Assistant

## 2020-07-30 VITALS — BP 118/78 | Ht 65.0 in | Wt 204.0 lb

## 2020-07-30 DIAGNOSIS — F4329 Adjustment disorder with other symptoms: Secondary | ICD-10-CM

## 2020-07-30 DIAGNOSIS — F902 Attention-deficit hyperactivity disorder, combined type: Secondary | ICD-10-CM | POA: Diagnosis not present

## 2020-07-30 DIAGNOSIS — F419 Anxiety disorder, unspecified: Secondary | ICD-10-CM | POA: Diagnosis not present

## 2020-07-30 MED ORDER — LORAZEPAM 0.5 MG PO TABS: 0.5000 mg | ORAL_TABLET | Freq: Two times a day (BID) | ORAL | 0 refills | Status: DC | PRN

## 2020-07-30 NOTE — Progress Notes (Signed)
Patient ID: Rebekah Porter, female   DOB: 22-Nov-1975, 45 y.o.   MRN: [id number] .Marland KitchenVirtual Visit via Video Note  I connected with Sharyon Medicus on 07/30/20 at  9:50 AM EDT by a video enabled telemedicine application and verified that I am speaking with the correct person using two identifiers.  Location: Patient: home Provider: clinic  .Marland KitchenParticipating in visit:  Patient: Rebekah Porter Provider: Tandy Gaw PA-C   I discussed the limitations of evaluation and management by telemedicine and the availability of in person appointments. The patient expressed understanding and agreed to proceed.  History of Present Illness: Patient is a 45 year old female with ADHD and anxiety who presents to the clinic to discuss increase in anxiety and stress.  Patient is on Adderall and her focus is fairly well controlled however she is under a lot of anxiety and stress right now. She is moving out of her house this week but her new home is not ready. She is having to move in with sister in law. She is worried about her anxiety and stress level. She is wanting benzodiazapine as needed to take during this time. She should only need for the next 2-3 weeks. She had klonapin when her mother in law lived with her for a few weeks.   .. Active Ambulatory Problems    Diagnosis Date Noted  . RHINITIS 08/04/2008  . WEIGHT GAIN 01/12/2009  . Routine general medical examination at a health care facility 06/06/2010  . Obesity (BMI 30-39.9) 05/09/2013  . Inattention 08/18/2013  . Adjustment disorder with mixed anxiety and depressed mood 06/05/2014  . Seasonal affective disorder (HCC) 04/21/2015  . Hyperlipidemia 04/21/2015  . ADHD (attention deficit hyperactivity disorder), combined type 07/20/2016  . Status post tubal ligation 09/12/2017  . Anxiety 07/30/2020   Resolved Ambulatory Problems    Diagnosis Date Noted  . No Resolved Ambulatory Problems   Past Medical History:  Diagnosis Date  . Obesity        Observations/Objective: No acute distress  Anxious mood.   .. Depression screen [facility] 2/9 07/30/2020 09/12/2017 07/20/2016 05/11/2013  Decreased Interest 2 0 1 0  Down, Depressed, Hopeless 2 0 0 0  PHQ - 2 Score 4 0 1 0  Altered sleeping 1 - - -  Tired, decreased energy 3 - - -  Change in appetite 3 - - -  Feeling bad or failure about yourself  1 - - -  Trouble concentrating 3 - - -  Moving slowly or fidgety/restless 0 - - -  Suicidal thoughts 0 - - -  PHQ-9 Score 15 - - -  Difficult doing work/chores Extremely dIfficult - - -   .Marland Kitchen GAD 7 : Generalized Anxiety Score 07/30/2020  Nervous, Anxious, on Edge 3  Control/stop worrying 3  Worry too much - different things 3  Trouble relaxing 3  Restless 0  Easily annoyed or irritable 3  Afraid - awful might happen 0  Total GAD 7 Score 15  Anxiety Difficulty Extremely difficult     Assessment and Plan: Marland KitchenMarland KitchenAnnsleigh was seen today for anxiety.  Diagnoses and all orders for this visit:  Stress and adjustment reaction -     LORazepam (ATIVAN) 0.5 MG tablet; Take 1 tablet (0.5 mg total) by mouth 2 (two) times daily as needed for anxiety.  Anxiety -     LORazepam (ATIVAN) 0.5 MG tablet; Take 1 tablet (0.5 mg total) by mouth 2 (two) times daily as needed for anxiety.  ADHD (attention deficit hyperactivity disorder),  combined type   .Marland KitchenPDMP reviewed during this encounter. No concerns.   Pt is taking Adderall daily. She needs something to get her through the next 2 weeks. She does not want to start a new daily medication. Lorazepam was sent for as needed use. Discussed controlled drug and dependence risk. Follow up as needed or in one month if anxiety/stress does not improve.    Follow Up Instructions:    I discussed the assessment and treatment plan with the patient. The patient was provided an opportunity to ask questions and all were answered. The patient agreed with the plan and demonstrated an understanding of the instructions.   The  patient was advised to call back or seek an in-person evaluation if the symptoms worsen or if the condition fails to improve as anticipated.   Tandy Gaw, PA-C

## 2020-07-30 NOTE — Progress Notes (Signed)
Wants to discuss recent anxiety PHQ9 (15) -GAD7 (15) completed.

## 2020-11-15 ENCOUNTER — Encounter: Payer: Self-pay | Admitting: Physician Assistant

## 2020-11-19 ENCOUNTER — Telehealth: Payer: Self-pay | Admitting: General Practice

## 2020-11-19 NOTE — Telephone Encounter (Addendum)
Transition Care Management Unsuccessful Follow-up Telephone Call  Date of discharge and from where:  11/18/20 from [facility]  Attempts:  1st Attempt  Reason for unsuccessful TCM follow-up call:  Voicemail box full

## 2020-11-22 NOTE — Telephone Encounter (Signed)
Transition Care Management Unsuccessful Follow-up Telephone Call  Date of discharge and from where:  11/18/20 from [facility]  Attempts:  2nd Attempt  Reason for unsuccessful TCM follow-up call:  Left voice message

## 2020-11-24 NOTE — Telephone Encounter (Signed)
Transition Care Management Unsuccessful Follow-up Telephone Call  Date of discharge and from where:  11/18/20 from [facility]  Attempts:  3rd Attempt  Reason for unsuccessful TCM follow-up call:  Left voice message Patient does have an OV scheduled for 11/30/20.

## 2020-11-30 ENCOUNTER — Inpatient Hospital Stay: Payer: 59 | Admitting: Physician Assistant

## 2021-01-19 ENCOUNTER — Ambulatory Visit (INDEPENDENT_AMBULATORY_CARE_PROVIDER_SITE_OTHER): Payer: 59 | Admitting: Physician Assistant

## 2021-01-19 ENCOUNTER — Other Ambulatory Visit (HOSPITAL_BASED_OUTPATIENT_CLINIC_OR_DEPARTMENT_OTHER): Payer: Self-pay | Admitting: Physician Assistant

## 2021-01-19 ENCOUNTER — Encounter: Payer: Self-pay | Admitting: Physician Assistant

## 2021-01-19 VITALS — BP 113/86 | HR 73 | Ht 65.0 in | Wt 214.0 lb

## 2021-01-19 DIAGNOSIS — Z1231 Encounter for screening mammogram for malignant neoplasm of breast: Secondary | ICD-10-CM

## 2021-01-19 DIAGNOSIS — Z23 Encounter for immunization: Secondary | ICD-10-CM | POA: Diagnosis not present

## 2021-01-19 DIAGNOSIS — E7800 Pure hypercholesterolemia, unspecified: Secondary | ICD-10-CM | POA: Diagnosis not present

## 2021-01-19 DIAGNOSIS — Z0000 Encounter for general adult medical examination without abnormal findings: Secondary | ICD-10-CM

## 2021-01-19 DIAGNOSIS — Z114 Encounter for screening for human immunodeficiency virus [HIV]: Secondary | ICD-10-CM

## 2021-01-19 DIAGNOSIS — Z1329 Encounter for screening for other suspected endocrine disorder: Secondary | ICD-10-CM | POA: Diagnosis not present

## 2021-01-19 DIAGNOSIS — K5904 Chronic idiopathic constipation: Secondary | ICD-10-CM

## 2021-01-19 DIAGNOSIS — E6609 Other obesity due to excess calories: Secondary | ICD-10-CM

## 2021-01-19 DIAGNOSIS — Z6835 Body mass index (BMI) 35.0-35.9, adult: Secondary | ICD-10-CM

## 2021-01-19 DIAGNOSIS — Z1159 Encounter for screening for other viral diseases: Secondary | ICD-10-CM

## 2021-01-19 MED ORDER — LINACLOTIDE 145 MCG PO CAPS: 145.0000 ug | ORAL_CAPSULE | Freq: Every day | ORAL | 1 refills | Status: DC

## 2021-01-19 MED ORDER — SAXENDA 18 MG/3ML ~~LOC~~ SOPN: 3.0000 mg | PEN_INJECTOR | Freq: Every day | SUBCUTANEOUS | 0 refills | Status: DC

## 2021-01-19 NOTE — Progress Notes (Signed)
Subjective:     Rebekah Porter is a 44 y.o. female and is here for a comprehensive physical exam. The patient reports problems - some problems with constipation. Has bowel movements every 3-4 days that are hard and she has to strain.   She uses OTc medications as needed. Ongoing for years.   Social History   Socioeconomic History   Marital status: Married    Spouse name: Not on file   Number of children: Not on file   Years of education: Not on file   Highest education level: Not on file  Occupational History   Not on file  Tobacco Use   Smoking status: Never   Smokeless tobacco: Never  Substance and Sexual Activity   Alcohol use: No   Drug use: No   Sexual activity: Yes    Birth control/protection: Surgical  Other Topics Concern   Not on file  Social History Narrative   Not on file   Social Determinants of Health   Financial Resource Strain: Not on file  Food Insecurity: Not on file  Transportation Needs: Not on file  Physical Activity: Not on file  Stress: Not on file  Social Connections: Not on file  Intimate Partner Violence: Not on file   Health Maintenance  Topic Date Due   Hepatitis C Screening  Never done   PAP SMEAR-Modifier  10/13/2018   INFLUENZA VACCINE  06/10/2021 (Originally 10/11/2020)   MAMMOGRAM  02/10/2021   COVID-19 Vaccine (3 - Booster for Moderna series) 02/22/2021   TETANUS/TDAP  01/20/2031   HIV Screening  Completed   Pneumococcal Vaccine 48-80 Years old  Aged Out   HPV VACCINES  Aged Out    The following portions of the patient's history were reviewed and updated as appropriate: allergies, current medications, past family history, past medical history, past social history, past surgical history, and problem list.  Review of Systems A comprehensive review of systems was negative.   Objective:    BP 113/86   Pulse 73   Ht 5\' 5"  (1.651 m)   Wt 214 lb (97.1 kg)   SpO2 100%   BMI 35.61 kg/m  General appearance: alert, cooperative,  appears stated age, and moderately obese Head: Normocephalic, without obvious abnormality, atraumatic Eyes: conjunctivae/corneas clear. PERRL, EOM's intact. Fundi benign. Ears: normal TM's and external ear canals both ears Nose: Nares normal. Septum midline. Mucosa normal. No drainage or sinus tenderness. Throat: lips, mucosa, and tongue normal; teeth and gums normal Neck: no adenopathy, no carotid bruit, no JVD, supple, symmetrical, trachea midline, and thyroid not enlarged, symmetric, no tenderness/mass/nodules Back: symmetric, no curvature. ROM normal. No CVA tenderness. Lungs: clear to auscultation bilaterally Heart: regular rate and rhythm, S1, S2 normal, no murmur, click, rub or gallop Abdomen: soft, non-tender; bowel sounds normal; no masses,  no organomegaly Extremities: extremities normal, atraumatic, no cyanosis or edema Pulses: 2+ and symmetric Skin: Skin color, texture, turgor normal. No rashes or lesions Lymph nodes: Cervical, supraclavicular, and axillary nodes normal. Neurologic: Grossly normal   .. Depression screen [facility] 2/9 01/19/2021 07/30/2020 09/12/2017 07/20/2016 05/11/2013  Decreased Interest 2 2 0 1 0  Down, Depressed, Hopeless 1 2 0 0 0  PHQ - 2 Score 3 4 0 1 0  Altered sleeping 1 1 - - -  Tired, decreased energy 2 3 - - -  Change in appetite 3 3 - - -  Feeling bad or failure about yourself  0 1 - - -  Trouble concentrating 0 3 - - -  Moving slowly or fidgety/restless 0 0 - - -  Suicidal thoughts 0 0 - - -  PHQ-9 Score 9 15 - - -  Difficult doing work/chores Very difficult Extremely dIfficult - - -   .Marland Kitchen GAD 7 : Generalized Anxiety Score 01/19/2021 07/30/2020  Nervous, Anxious, on Edge 1 3  Control/stop worrying 1 3  Worry too much - different things 0 3  Trouble relaxing 0 3  Restless 0 0  Easily annoyed or irritable 1 3  Afraid - awful might happen 0 0  Total GAD 7 Score 3 15  Anxiety Difficulty Somewhat difficult Extremely difficult     Assessment:     Healthy female exam.     Plan:    Marland KitchenMarland KitchenLoumay was seen today for annual exam.  Diagnoses and all orders for this visit:  Routine general medical examination at a health care facility -     Lipid Panel w/reflex Direct LDL -     TSH  Pure hypercholesterolemia -     Lipid Panel w/reflex Direct LDL  Thyroid disorder screen -     TSH  Need for Tdap vaccination -     Tdap vaccine greater than or equal to 7yo IM  Chronic idiopathic constipation -     linaclotide (LINZESS) 145 MCG CAPS capsule; Take 1 capsule (145 mcg total) by mouth daily before breakfast.  Encounter for hepatitis C screening test for low risk patient -     Hepatitis C Antibody  Screening for HIV (human immunodeficiency virus) -     HIV antibody (with reflex)  Class 2 obesity due to excess calories without serious comorbidity with body mass index (BMI) of 35.0 to 35.9 in adult -     Liraglutide -Weight Management (SAXENDA) 18 MG/3ML SOPN; Inject 3 mg into the skin daily. 0.6 mg inj subcut daily for 1 week, then incr by 0.6 mg weekly until reaching 3 mg injected subcut daily  Other orders -     Discontinue: Liraglutide -Weight Management (SAXENDA) 18 MG/3ML SOPN; Inject 3 mg into the skin daily. 0.6 mg inj subcut daily for 1 week, then incr by 0.6 mg weekly until reaching 3 mg injected subcut daily  .Marland Kitchen Discussed 150 minutes of exercise a week.  Encouraged vitamin D 1000 units and Calcium 1300mg  or 4 servings of dairy a day.  PHQ/GAD not to goal sees BH.  Fasting labs ordered.  Discussed weight loss.  Started saxenda. Discussed medication.  Follow up in 3 months.  Pap and mammogram done with GYN. Needs pap.  Covid vaccine x2.  Tdap and flu shot given.  Linzess to try for constipation. Discussed diet changes. Consider probiotic.  See After Visit Summary for Counseling Recommendations

## 2021-01-19 NOTE — Patient Instructions (Signed)

## 2021-01-20 ENCOUNTER — Encounter: Payer: Self-pay | Admitting: Physician Assistant

## 2021-01-20 LAB — HEPATITIS C ANTIBODY
Hepatitis C Ab: NONREACTIVE
SIGNAL TO CUT-OFF: 0.04 (ref <1.00)

## 2021-01-20 LAB — LIPID PANEL W/REFLEX DIRECT LDL
Cholesterol: 228 mg/dL — ABNORMAL HIGH (ref <200)
HDL: 45 mg/dL — ABNORMAL LOW (ref >=50)
LDL Cholesterol (Calc): 155 mg/dL (calc) — ABNORMAL HIGH
Non-HDL Cholesterol (Calc): 183 mg/dL (calc) — ABNORMAL HIGH (ref <130)
Total CHOL/HDL Ratio: 5.1 (calc) — ABNORMAL HIGH (ref <5.0)
Triglycerides: 149 mg/dL (ref <150)

## 2021-01-20 LAB — HIV ANTIBODY (ROUTINE TESTING W REFLEX): HIV 1&2 Ab, 4th Generation: NONREACTIVE

## 2021-01-20 LAB — TSH: TSH: 4.08 mIU/L

## 2021-01-20 NOTE — Progress Notes (Signed)
Thyroid normal range but in the upper limits of normal.  LDL, bad cholesterol, is elevated.  HDL, good cholesterol is low.  Work on diet and weight loss. Limited fried and processed foods.

## 2021-01-26 ENCOUNTER — Encounter: Payer: Self-pay | Admitting: Physician Assistant

## 2021-01-26 ENCOUNTER — Ambulatory Visit (INDEPENDENT_AMBULATORY_CARE_PROVIDER_SITE_OTHER): Payer: 59

## 2021-01-26 ENCOUNTER — Other Ambulatory Visit: Payer: Self-pay

## 2021-01-26 DIAGNOSIS — Z1231 Encounter for screening mammogram for malignant neoplasm of breast: Secondary | ICD-10-CM | POA: Diagnosis not present

## 2021-01-28 MED ORDER — NALTREXONE-BUPROPION HCL ER 8-90 MG PO TB12: 2.0000 | ORAL_TABLET | Freq: Two times a day (BID) | ORAL | 2 refills | Status: DC

## 2021-01-28 MED ORDER — NALTREXONE-BUPROPION HCL ER 8-90 MG PO TB12: ORAL_TABLET | ORAL | 0 refills | Status: DC

## 2021-01-28 NOTE — Progress Notes (Signed)
Normal mammogram. Follow up in 1 year.

## 2021-01-28 NOTE — Telephone Encounter (Signed)
Can you look into her saxenda? Maybe coupon card?

## 2021-01-31 ENCOUNTER — Telehealth: Payer: Self-pay

## 2021-01-31 NOTE — Telephone Encounter (Signed)
Medication: linaclotide (LINZESS) 145 MCG CAPS capsule Prior authorization submitted via CoverMyMeds on 01/31/2021 PA submission pending

## 2021-02-07 ENCOUNTER — Encounter: Payer: Self-pay | Admitting: Physician Assistant

## 2021-02-07 MED ORDER — SCOPOLAMINE 1 MG/3DAYS TD PT72: 1.0000 | MEDICATED_PATCH | TRANSDERMAL | 0 refills | Status: DC

## 2021-05-13 ENCOUNTER — Encounter: Payer: Self-pay | Admitting: Physician Assistant

## 2021-05-13 DIAGNOSIS — E669 Obesity, unspecified: Secondary | ICD-10-CM

## 2021-07-12 ENCOUNTER — Encounter: Payer: Self-pay | Admitting: Physician Assistant

## 2021-07-13 NOTE — Telephone Encounter (Signed)
Added to appt notes so can perform STOP BANG questionnaire at visit.  ?

## 2021-07-13 NOTE — Telephone Encounter (Signed)
Patient has been scheduled for a virtual with Jade on Monday, 07/18/21. ?

## 2021-07-18 ENCOUNTER — Telehealth (INDEPENDENT_AMBULATORY_CARE_PROVIDER_SITE_OTHER): Payer: 59 | Admitting: Physician Assistant

## 2021-07-18 ENCOUNTER — Encounter: Payer: Self-pay | Admitting: Physician Assistant

## 2021-07-18 VITALS — Ht 65.0 in | Wt 217.0 lb

## 2021-07-18 DIAGNOSIS — R5383 Other fatigue: Secondary | ICD-10-CM

## 2021-07-18 DIAGNOSIS — G478 Other sleep disorders: Secondary | ICD-10-CM

## 2021-07-18 DIAGNOSIS — R0982 Postnasal drip: Secondary | ICD-10-CM

## 2021-07-18 DIAGNOSIS — Z6836 Body mass index (BMI) 36.0-36.9, adult: Secondary | ICD-10-CM

## 2021-07-18 DIAGNOSIS — E6609 Other obesity due to excess calories: Secondary | ICD-10-CM

## 2021-07-18 DIAGNOSIS — R0683 Snoring: Secondary | ICD-10-CM

## 2021-07-18 DIAGNOSIS — F419 Anxiety disorder, unspecified: Secondary | ICD-10-CM

## 2021-07-18 MED ORDER — IPRATROPIUM BROMIDE 0.03 % NA SOLN: 2.0000 | Freq: Two times a day (BID) | NASAL | 2 refills | Status: DC

## 2021-07-18 MED ORDER — CETIRIZINE HCL 10 MG PO TABS: 10.0000 mg | ORAL_TABLET | Freq: Every day | ORAL | 1 refills | Status: DC

## 2021-07-18 NOTE — Progress Notes (Signed)
..Virtual Visit via Video Note ? ?I connected with Rebekah Porter on 07/19/21 at  8:50 AM EDT by a video enabled telemedicine application and verified that I am speaking with the correct person using two identifiers. ? ?Location: ?Patient: Rebekah Porter ?Provider: clinic ? ?Marland Kitchen.Participating in visit:  ?Patient: Rebekah Porter ?Provider: Tandy Gaw PA-C ?  ?I discussed the limitations of evaluation and management by telemedicine and the availability of in person appointments. The patient expressed understanding and agreed to proceed. ? ?History of Present Illness: ?Pt is a 46 yo obese female who calls into the clinic wanting a sleep study. She continues to struggle with excessive daytime sleepiness. She is on stimulant for ADHD and just not helping. She sleeps at night but does not wake up feeling rested. She does snore. She also has a lot of drainage going down her throat at night. She will wake up from time to time and cough up stuff and then struggle to go back to sleep.  ? ? ?Active Ambulatory Problems  ?  Diagnosis Date Noted  ? RHINITIS 08/04/2008  ? WEIGHT GAIN 01/12/2009  ? Routine general medical examination at a health care facility 06/06/2010  ? Obesity (BMI 30-39.9) 05/09/2013  ? Inattention 08/18/2013  ? Adjustment disorder with mixed anxiety and depressed mood 06/05/2014  ? Seasonal affective disorder (HCC) 04/21/2015  ? Dyslipidemia (high LDL; low HDL) 04/21/2015  ? ADHD (attention deficit hyperactivity disorder), combined type 07/20/2016  ? Status post tubal ligation 09/12/2017  ? Anxiety 07/30/2020  ? Chronic idiopathic constipation 01/19/2021  ? Class 2 obesity due to excess calories without serious comorbidity with body mass index (BMI) of 35.0 to 35.9 in adult 01/19/2021  ? Snoring 07/18/2021  ? Non-restorative sleep 07/18/2021  ? No energy 07/18/2021  ? ?Resolved Ambulatory Problems  ?  Diagnosis Date Noted  ? No Resolved Ambulatory Problems  ? ?Past Medical History:  ?Diagnosis Date  ? Obesity   ? ? ? ?   ?Observations/Objective: ?No acute distress ?Normal mood and appearance ?Normal breathing ? ?.. ?Today's Vitals  ? 07/18/21 0841  ?Weight: 217 lb (98.4 kg)  ?Height: 5\' 5"  (1.651 m)  ? ?Body mass index is 36.11 kg/m?. ? ?STOP BANG high risk ? ?Assessment and Plan: ?..Uchechi was seen today for apnea. ? ?Diagnoses and all orders for this visit: ? ?Non-restorative sleep ?-     Home sleep test ? ?Snoring ?-     Home sleep test ? ?No energy ?-     Home sleep test ? ?PND (post-nasal drip) ?-     cetirizine (ZYRTEC) 10 MG tablet; Take 1 tablet (10 mg total) by mouth daily. ?-     ipratropium (ATROVENT) 0.03 % nasal spray; Place 2 sprays into both nostrils every 12 (twelve) hours. ? ?Anxiety ? ? ?Discussed with patient she is high risk via stop bang.  ?Home sleep study ordered.  ? ?I do not think the the choking on drip is due to apnea. ?Sounds more like PND ?Start nasal spray and anti-histamine with decongestant in it ? ? ?Follow Up Instructions: ? ?  ?I discussed the assessment and treatment plan with the patient. The patient was provided an opportunity to ask questions and all were answered. The patient agreed with the plan and demonstrated an understanding of the instructions. ?  ?The patient was advised to call back or seek an in-person evaluation if the symptoms worsen or if the condition fails to improve as anticipated. ? ? ? ?Nile Dear, PA-C ? ?

## 2021-07-18 NOTE — Patient Instructions (Signed)
Zyrtec at bedtime  ?Nasal spray  ?Sleep study  ?

## 2021-07-18 NOTE — Progress Notes (Signed)
Pt states she has been sleeping but still tired through out the day and she been coughing /choking on saliva. ?

## 2021-07-19 DIAGNOSIS — R0982 Postnasal drip: Secondary | ICD-10-CM | POA: Insufficient documentation

## 2021-08-01 ENCOUNTER — Telehealth: Payer: Self-pay | Admitting: Neurology

## 2021-08-01 NOTE — Telephone Encounter (Signed)
LVM letting patient know order was placed and gave her number to [facility].

## 2021-08-01 NOTE — Telephone Encounter (Signed)
Patient called stating she hasn't heard about sleep study. I reordered as future. Can we make sure this gets to the right place?

## 2021-08-01 NOTE — Addendum Note (Signed)
Addended bySilvio Pate on: 08/01/2021 11:19 AM   Modules accepted: Orders

## 2021-10-12 ENCOUNTER — Encounter: Payer: Self-pay | Admitting: Neurology

## 2021-10-19 ENCOUNTER — Ambulatory Visit (HOSPITAL_BASED_OUTPATIENT_CLINIC_OR_DEPARTMENT_OTHER): Payer: 59 | Attending: Physician Assistant | Admitting: Internal Medicine

## 2021-12-30 ENCOUNTER — Other Ambulatory Visit: Payer: Self-pay | Admitting: Physician Assistant

## 2021-12-30 DIAGNOSIS — Z1231 Encounter for screening mammogram for malignant neoplasm of breast: Secondary | ICD-10-CM

## 2022-01-03 ENCOUNTER — Encounter: Payer: 59 | Admitting: Obstetrics and Gynecology

## 2022-01-10 ENCOUNTER — Encounter: Payer: Self-pay | Admitting: Obstetrics and Gynecology

## 2022-01-10 ENCOUNTER — Other Ambulatory Visit (HOSPITAL_COMMUNITY)
Admission: RE | Admit: 2022-01-10 | Discharge: 2022-01-10 | Disposition: A | Discharge status: Home / Self Care | Payer: 59 | Source: Ambulatory Visit | Attending: Obstetrics and Gynecology | Admitting: Obstetrics and Gynecology

## 2022-01-10 ENCOUNTER — Ambulatory Visit (INDEPENDENT_AMBULATORY_CARE_PROVIDER_SITE_OTHER): Payer: 59 | Admitting: Obstetrics and Gynecology

## 2022-01-10 VITALS — BP 123/88 | HR 82 | Ht 65.0 in | Wt 211.0 lb

## 2022-01-10 DIAGNOSIS — Z01419 Encounter for gynecological examination (general) (routine) without abnormal findings: Secondary | ICD-10-CM

## 2022-01-10 NOTE — Progress Notes (Signed)
GYNECOLOGY ANNUAL PREVENTATIVE CARE ENCOUNTER NOTE  History:     Rebekah Porter is a 46 y.o. G23P1001 female here for a routine annual gynecologic exam.  Current complaints: none.   Denies abnormal vaginal bleeding, discharge, pelvic pain, problems with intercourse or other gynecologic concerns.     Recently separated from husband. Not currently sexually active   Gynecologic History Patient's last menstrual period was 01/02/2022 (approximate). Contraception:  Essure  Last Pap: 2020. Result was normal with negative HPV Last Mammogram: 2022.  Result was normal, Next mammo is scheduled 11/22   Obstetric History OB History  Gravida Para Term Preterm AB Living  1 1 1     1   SAB IAB Ectopic Multiple Live Births               # Outcome Date GA Lbr Len/2nd Weight Sex Delivery Anes PTL Lv  1 Term             Past Medical History:  Diagnosis Date   Obesity     Past Surgical History:  Procedure Laterality Date   ESSURE TUBAL LIGATION      Current Outpatient Medications on File Prior to Visit  Medication Sig Dispense Refill   amphetamine-dextroamphetamine (ADDERALL) 15 MG tablet Take 20 mg by mouth 2 (two) times daily.     No current facility-administered medications on file prior to visit.    Allergies  Allergen Reactions   Contrave [Naltrexone-Bupropion Hcl Er] Other (See Comments)    Starving. Did not help.     Social History:  reports that she has never smoked. She has never used smokeless tobacco. She reports that she does not drink alcohol and does not use drugs.  Family History  Problem Relation Age of Onset   Alcohol abuse Father    Asthma Mother        chronic   Diabetes Paternal Grandmother    Hypertension Paternal Grandmother     The following portions of the patient's history were reviewed and updated as appropriate: allergies, current medications, past family history, past medical history, past social history, past surgical history and problem  list.  Review of Systems Pertinent items noted in HPI and remainder of comprehensive ROS otherwise negative.  Physical Exam:  BP 123/88   Pulse 82   Ht 5\' 5"  (1.651 m)   Wt 211 lb (95.7 kg)   LMP 01/02/2022 (Approximate)   BMI 35.11 kg/m  CONSTITUTIONAL: Well-developed, well-nourished female in no acute distress.  HENT:  Normocephalic, atraumatic, External right and left ear normal.  EYES: Conjunctivae and EOM are normal. Pupils are equal, round, and reactive to light. No scleral icterus.  NECK: Normal range of motion, supple, no masses.  Normal thyroid.  SKIN: Skin is warm and dry. No rash noted. Not diaphoretic. No erythema. No pallor. MUSCULOSKELETAL: Normal range of motion. No tenderness.  No cyanosis, clubbing, or edema. NEUROLOGIC: Alert and oriented to person, place, and time. Normal reflexes, muscle tone coordination.  PSYCHIATRIC: Normal mood and affect. Normal behavior. Normal judgment and thought content. CARDIOVASCULAR: Normal heart rate noted, regular rhythm RESPIRATORY: Clear to auscultation bilaterally. Effort and breath sounds normal, no problems with respiration noted. BREASTS: Symmetric in size. No masses, tenderness, skin changes, nipple drainage, or lymphadenopathy bilaterally. Performed in the presence of a chaperone. ABDOMEN: Soft, no distention noted.  No tenderness, rebound or guarding.  PELVIC: Normal appearing external genitalia and urethral meatus; normal appearing vaginal mucosa and cervix.  No abnormal vaginal discharge noted.  Pap smear obtained.  Normal uterine size, no other palpable masses, no uterine or adnexal tenderness.  Performed in the presence of a chaperone.   Assessment and Plan:   1. Well woman exam  - MM Digital Screening; Future - Cytology - PAP    Will follow up results of pap smear and manage accordingly. Mammogram scheduled Routine preventative health maintenance measures emphasized. Please refer to After Visit Summary for other  counseling recommendations.     Rebekah Porter, Rebekah Porter, Rebekah Porter for [employer], [place]

## 2022-01-12 LAB — CYTOLOGY - PAP
Chlamydia: NEGATIVE
Comment: NEGATIVE
Comment: NEGATIVE
Comment: NEGATIVE
Comment: NORMAL
Diagnosis: NEGATIVE
High risk HPV: NEGATIVE
Neisseria Gonorrhea: NEGATIVE
Trichomonas: NEGATIVE

## 2022-02-01 ENCOUNTER — Ambulatory Visit: Payer: 59

## 2022-04-06 ENCOUNTER — Encounter: Payer: Self-pay | Admitting: Emergency Medicine

## 2022-04-06 ENCOUNTER — Ambulatory Visit
Admission: EM | Admit: 2022-04-06 | Discharge: 2022-04-06 | Disposition: A | Discharge status: Home / Self Care | Payer: 59 | Attending: Family Medicine | Admitting: Family Medicine

## 2022-04-06 DIAGNOSIS — N898 Other specified noninflammatory disorders of vagina: Secondary | ICD-10-CM | POA: Diagnosis not present

## 2022-04-06 NOTE — ED Triage Notes (Signed)
Pt reports vaginal itching and thick white vaginal discharge x 1 day.

## 2022-04-06 NOTE — Discharge Instructions (Signed)
Check MyChart for your test results You will be called if any results are positive

## 2022-04-06 NOTE — ED Provider Notes (Signed)
[facility]    CSN: [id number] Arrival date & time: 04/06/22  1611      History   Chief Complaint Chief Complaint  Patient presents with   Vaginal Itching    Need to confirm if yeast infection and receive treatment - Entered by patient    HPI Rebekah Porter is a 47 y.o. female.   HPI Patient developed some vaginal itching and discharge yesterday.  She request testing for vaginitis prior to treatment. She does not have any rash No fever chills No abdominal pain No urinary symptoms  Past Medical History:  Diagnosis Date   Obesity     Patient Active Problem List   Diagnosis Date Noted   PND (post-nasal drip) 07/19/2021   Snoring 07/18/2021   Non-restorative sleep 07/18/2021   No energy 07/18/2021   Chronic idiopathic constipation 01/19/2021   Class 2 obesity due to excess calories without serious comorbidity with body mass index (BMI) of 36.0 to 36.9 in adult 01/19/2021   Anxiety 07/30/2020   Status post tubal ligation 09/12/2017   ADHD (attention deficit hyperactivity disorder), combined type 07/20/2016   Seasonal affective disorder ([place]) 04/21/2015   Dyslipidemia (high LDL; low HDL) 04/21/2015   Adjustment disorder with mixed anxiety and depressed mood 06/05/2014   Inattention 08/18/2013   Obesity (BMI 30-39.9) 05/09/2013   Routine general medical examination at a health care facility 06/06/2010   WEIGHT GAIN 01/12/2009    Past Surgical History:  Procedure Laterality Date   ESSURE TUBAL LIGATION      OB History     Gravida  1   Para  1   Term  1   Preterm      AB      Living  1      SAB      IAB      Ectopic      Multiple      Live Births               Home Medications    Prior to Admission medications   Medication Sig Start Date End Date Taking? Authorizing Provider  methylphenidate (RITALIN) 5 MG tablet Take 5 mg by mouth 2 (two) times daily. 03/28/22  Yes [provider]    Family History Family  History  Problem Relation Age of Onset   Alcohol abuse Father    Asthma Mother        chronic   Diabetes Paternal Grandmother    Hypertension Paternal Grandmother     Social History Social History   Tobacco Use   Smoking status: Never   Smokeless tobacco: Never  Substance Use Topics   Alcohol use: No   Drug use: No     Allergies   Contrave [naltrexone-bupropion hcl er]   Review of Systems Review of Systems HPI  Physical Exam Triage Vital Signs ED Triage Vitals [04/06/22 1618]  Enc Vitals Group     BP (!) 143/82     Pulse Rate 82     Resp 17     Temp 98.7 F (37.1 C)     Temp Source Oral     SpO2 98 %     Weight      Height      Head Circumference      Peak Flow      Pain Score 0     Pain Loc      Pain Edu?      Excl. in [place]?  No data found.  Updated Vital Signs BP (!) 143/82 (BP Location: Right Arm)   Pulse 82   Temp 98.7 F (37.1 C) (Oral)   Resp 17   LMP 03/16/2022 (Approximate)   SpO2 98%       Physical Exam Constitutional:      General: She is not in acute distress.    Appearance: She is well-developed.  HENT:     Head: Normocephalic and atraumatic.  Eyes:     Conjunctiva/sclera: Conjunctivae normal.     Pupils: Pupils are equal, round, and reactive to light.  Cardiovascular:     Rate and Rhythm: Normal rate.  Pulmonary:     Effort: Pulmonary effort is normal. No respiratory distress.  Abdominal:     General: There is no distension.     Palpations: Abdomen is soft.  Musculoskeletal:        General: Normal range of motion.     Cervical back: Normal range of motion.  Skin:    General: Skin is warm and dry.  Neurological:     Mental Status: She is alert.   Discussed unprotected sex, STD, vaginitis   UC Treatments / Results  Labs (all labs ordered are listed, but only abnormal results are displayed) Labs Reviewed  CERVICOVAGINAL ANCILLARY ONLY    EKG   Radiology No results found.  Procedures Procedures (including  critical care time)  Medications Ordered in UC Medications - No data to display  Initial Impression / Assessment and Plan / UC Course  I have reviewed the triage vital signs and the nursing notes.  Pertinent labs & imaging results that were available during my care of the patient were reviewed by me and considered in my medical decision making (see chart for details).     Final Clinical Impressions(s) / UC Diagnoses   Final diagnoses:  Vaginal itching     Discharge Instructions      Check MyChart for your test results You will be called if any results are positive   ED Prescriptions   None    PDMP not reviewed this encounter.   Raylene Everts, MD 04/06/22 [phone]

## 2022-04-07 ENCOUNTER — Telehealth: Payer: Self-pay | Admitting: Emergency Medicine

## 2022-04-07 LAB — CERVICOVAGINAL ANCILLARY ONLY
Bacterial Vaginitis (gardnerella): NEGATIVE
Candida Glabrata: NEGATIVE
Candida Vaginitis: POSITIVE — AB
Chlamydia: NEGATIVE
Comment: NEGATIVE
Comment: NEGATIVE
Comment: NEGATIVE
Comment: NEGATIVE
Comment: NEGATIVE
Comment: NORMAL
Neisseria Gonorrhea: NEGATIVE
Trichomonas: NEGATIVE

## 2022-04-07 MED ORDER — FLUCONAZOLE 150 MG PO TABS: 150.0000 mg | ORAL_TABLET | ORAL | 0 refills | Status: DC

## 2022-04-07 NOTE — Telephone Encounter (Signed)
Patient called regarding her lab results she just viewed on mychart.  Patient was positive for yeast.  Requesting medication to be sent to pharmacy.  [facility] on [address] in [place].

## 2022-04-07 NOTE — Telephone Encounter (Signed)
Script sent for fluconazole to treat yeast vaginitis.

## 2022-06-16 ENCOUNTER — Telehealth: Payer: 59 | Admitting: Nurse Practitioner

## 2022-06-16 DIAGNOSIS — B3731 Acute candidiasis of vulva and vagina: Secondary | ICD-10-CM | POA: Diagnosis not present

## 2022-06-16 MED ORDER — FLUCONAZOLE 150 MG PO TABS: 150.0000 mg | ORAL_TABLET | Freq: Once | ORAL | 0 refills | Status: AC

## 2022-06-16 NOTE — Progress Notes (Signed)

## 2022-07-28 ENCOUNTER — Other Ambulatory Visit: Payer: Self-pay | Admitting: Physician Assistant

## 2022-07-28 DIAGNOSIS — R5383 Other fatigue: Secondary | ICD-10-CM

## 2022-07-28 DIAGNOSIS — R0683 Snoring: Secondary | ICD-10-CM

## 2022-07-28 DIAGNOSIS — G478 Other sleep disorders: Secondary | ICD-10-CM

## 2022-12-26 ENCOUNTER — Other Ambulatory Visit: Payer: Self-pay | Admitting: Physician Assistant

## 2022-12-26 DIAGNOSIS — Z1231 Encounter for screening mammogram for malignant neoplasm of breast: Secondary | ICD-10-CM

## 2023-01-23 IMAGING — MG MM DIGITAL SCREENING BILAT W/ TOMO AND CAD
8 series · 8 of 24 positions shown · non-contrast
Comparison: Previous exam(s).

CLINICAL DATA: Screening.

EXAM:
DIGITAL SCREENING BILATERAL MAMMOGRAM WITH TOMOSYNTHESIS AND CAD
TECHNIQUE: Bilateral screening digital craniocaudal and mediolateral oblique
mammograms were obtained. Bilateral screening digital breast
tomosynthesis was performed. The images were evaluated with
computer-aided detection.

[L CC synth-2D]
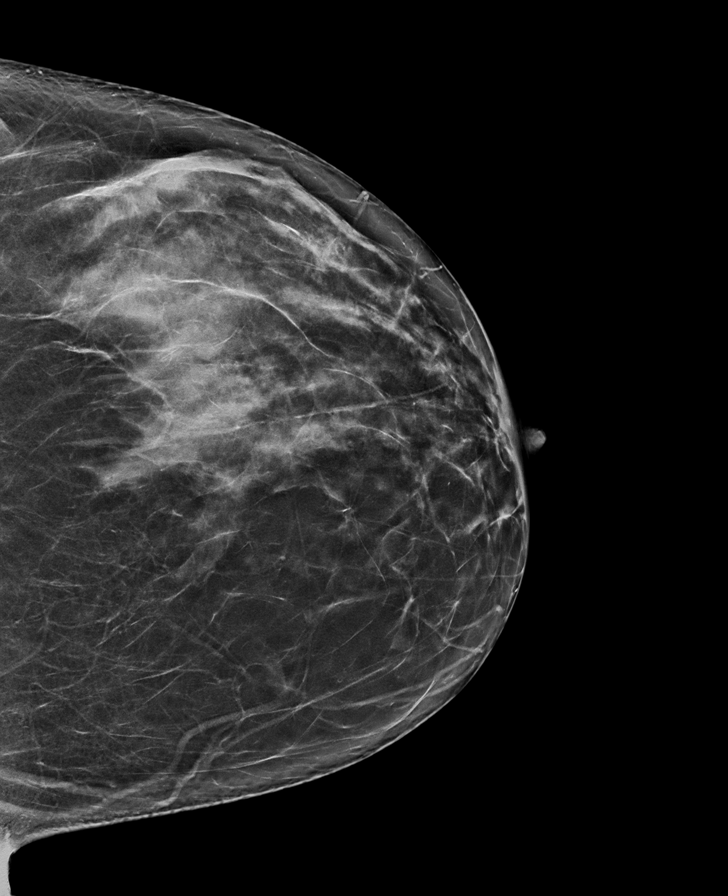

[L MLO synth-2D]
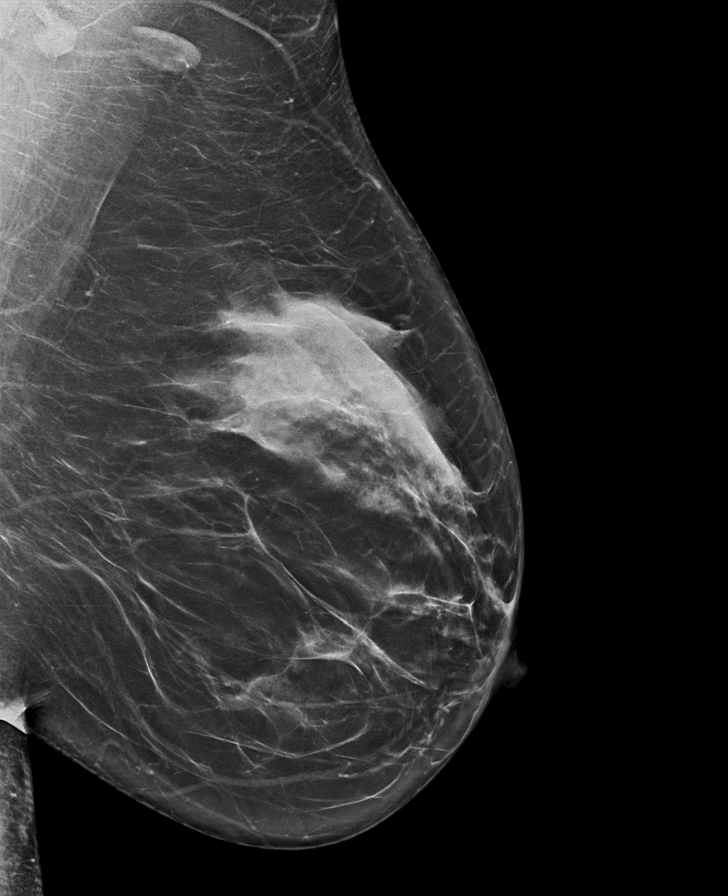

[R CC synth-2D]
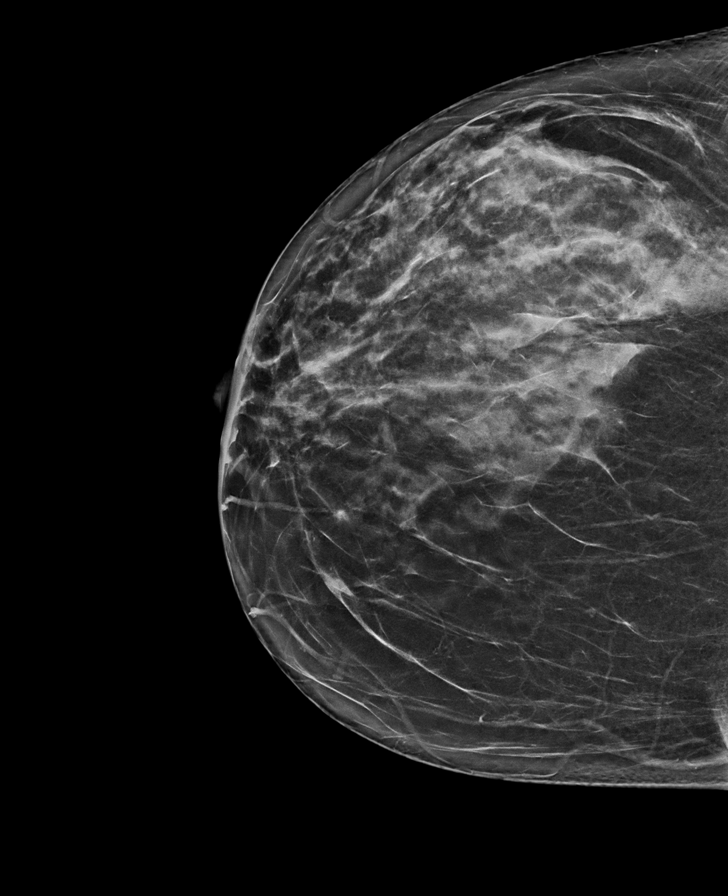

[R MLO synth-2D]
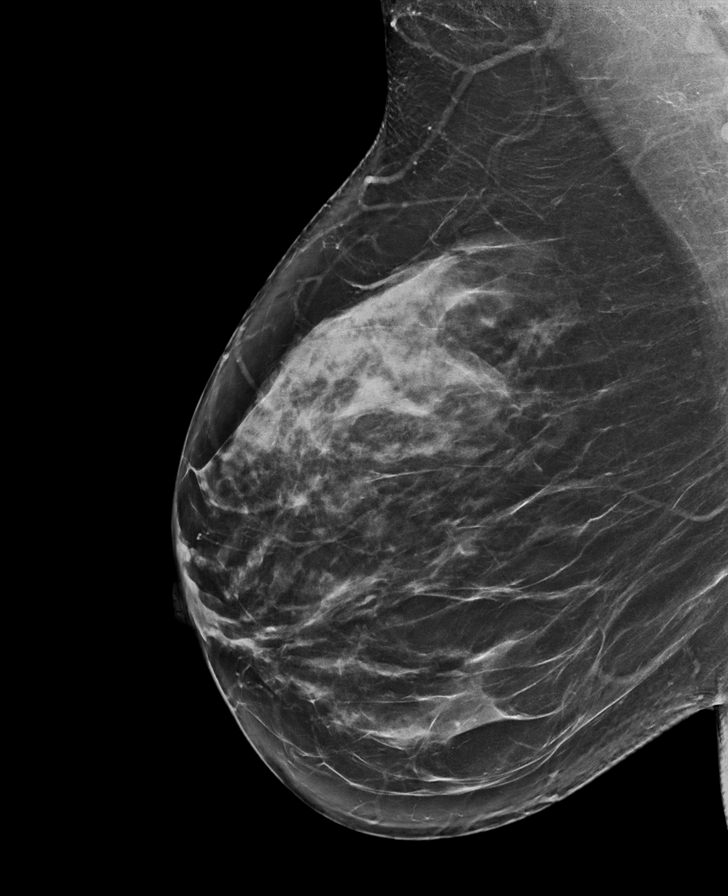

[R MLO tomo · tomo slice 46/91.0]
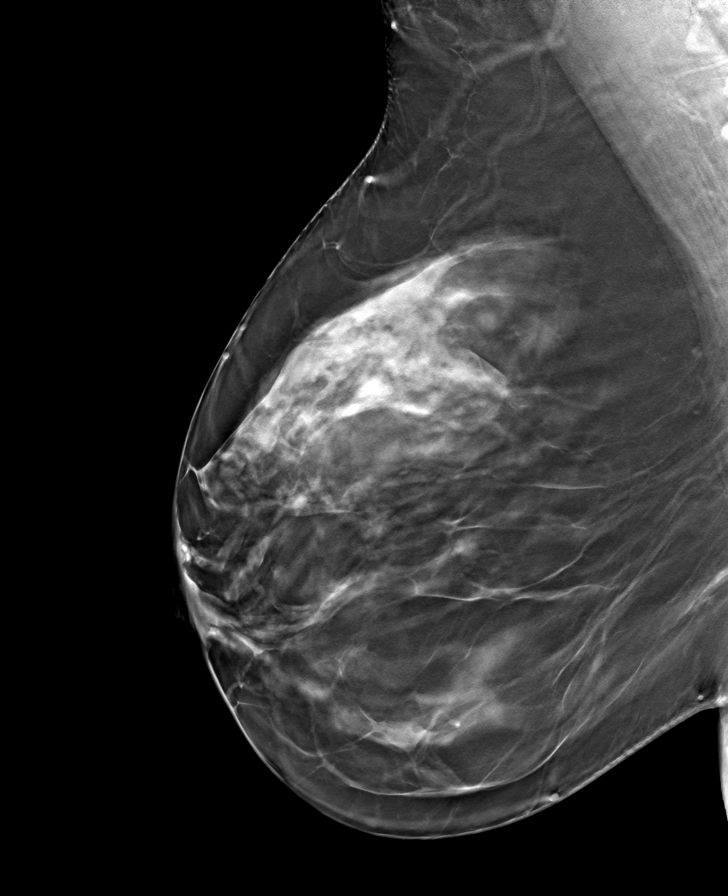

[R CC tomo · tomo slice 39/78.0]
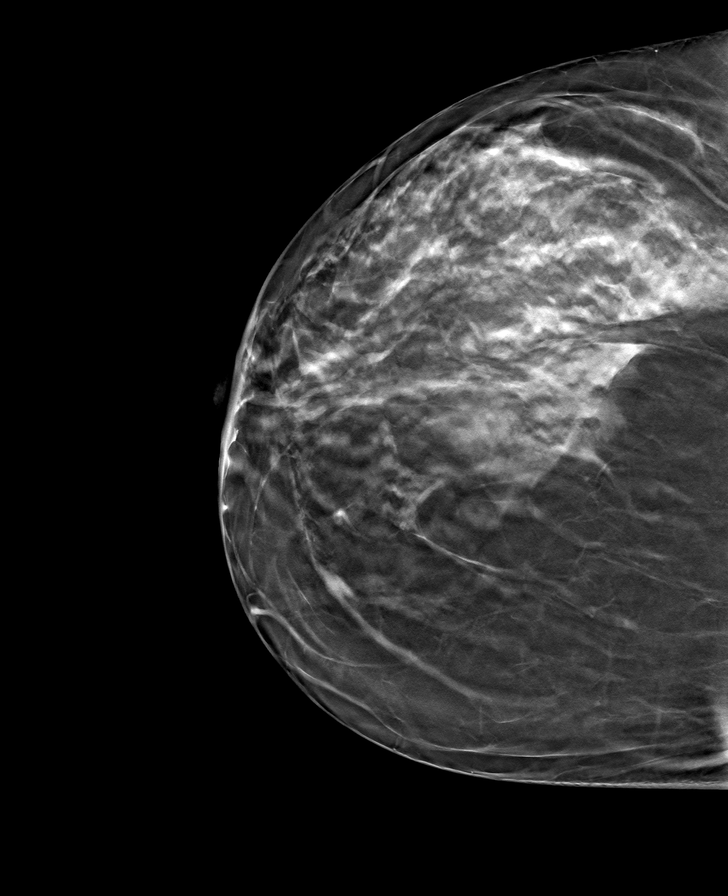

[L MLO tomo · tomo slice 47/93.0]
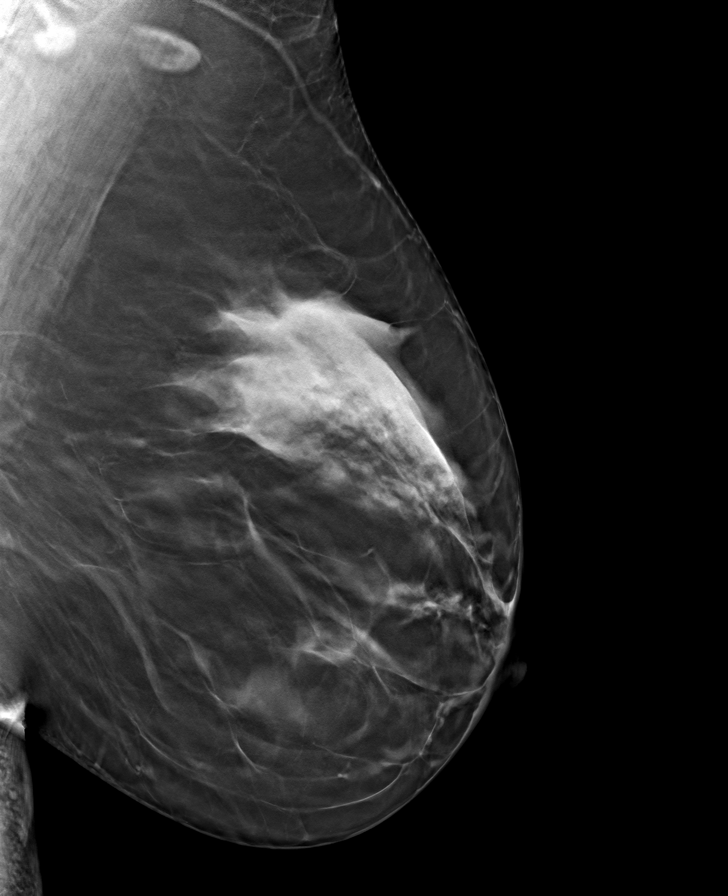

[L CC tomo · tomo slice 39/78.0]
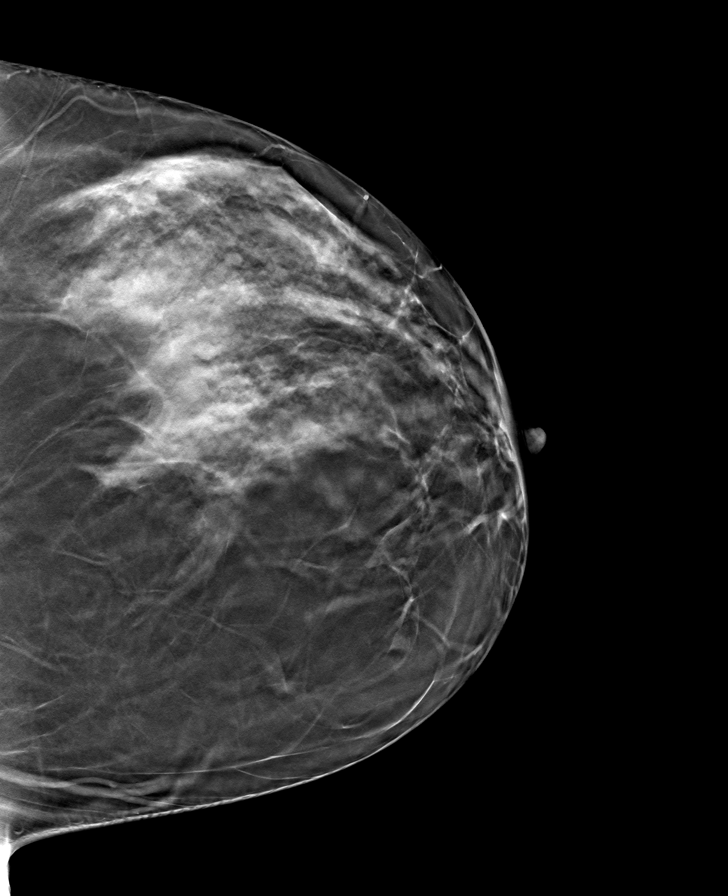

[8 of 24 positions shown; findings below may reference images not displayed]

ACR Breast Density Category c: The breast tissue is heterogeneously
dense, which may obscure small masses.
FINDINGS: There are no findings suspicious for malignancy.
IMPRESSION: No mammographic evidence of malignancy. A result letter of this
screening mammogram will be mailed directly to the patient.

RECOMMENDATION:
Screening mammogram in one year. (Code:Q3-W-BC3)

BI-RADS CATEGORY  1: Negative.

## 2023-07-23 ENCOUNTER — Other Ambulatory Visit: Payer: Self-pay | Admitting: Physician Assistant

## 2023-07-23 DIAGNOSIS — Z1231 Encounter for screening mammogram for malignant neoplasm of breast: Secondary | ICD-10-CM

## 2023-07-26 ENCOUNTER — Telehealth: Payer: Self-pay

## 2023-07-26 DIAGNOSIS — E785 Hyperlipidemia, unspecified: Secondary | ICD-10-CM

## 2023-07-26 DIAGNOSIS — R5383 Other fatigue: Secondary | ICD-10-CM

## 2023-07-26 DIAGNOSIS — Z131 Encounter for screening for diabetes mellitus: Secondary | ICD-10-CM

## 2023-07-26 DIAGNOSIS — Z0000 Encounter for general adult medical examination without abnormal findings: Secondary | ICD-10-CM

## 2023-07-26 NOTE — Telephone Encounter (Signed)
 Copied from CRM [id number]. Topic: Clinical - Request for Lab/Test Order >> Jul 26, 2023  3:00 PM Brittney F wrote: Reason for CRM:   Patient is requesting all routine lab order be placed so she can go and have the labs taken before her scheduled annual physical on May 23rd.   Please place orders and contact patient to inform the orders have been placed so she can go have them drawn.  Callback Number: [id number]

## 2023-07-27 ENCOUNTER — Ambulatory Visit: Admitting: Certified Nurse Midwife

## 2023-07-27 ENCOUNTER — Other Ambulatory Visit (HOSPITAL_COMMUNITY)
Admission: RE | Admit: 2023-07-27 | Discharge: 2023-07-27 | Disposition: A | Discharge status: Home / Self Care | Source: Ambulatory Visit | Attending: Certified Nurse Midwife | Admitting: Certified Nurse Midwife

## 2023-07-27 ENCOUNTER — Encounter: Payer: Self-pay | Admitting: Certified Nurse Midwife

## 2023-07-27 VITALS — BP 125/89 | HR 76 | Ht 65.0 in | Wt 228.0 lb

## 2023-07-27 DIAGNOSIS — Z1239 Encounter for other screening for malignant neoplasm of breast: Secondary | ICD-10-CM

## 2023-07-27 DIAGNOSIS — Z124 Encounter for screening for malignant neoplasm of cervix: Secondary | ICD-10-CM

## 2023-07-27 DIAGNOSIS — N898 Other specified noninflammatory disorders of vagina: Secondary | ICD-10-CM

## 2023-07-27 DIAGNOSIS — Z01419 Encounter for gynecological examination (general) (routine) without abnormal findings: Secondary | ICD-10-CM | POA: Diagnosis not present

## 2023-07-27 NOTE — Addendum Note (Signed)
 Addended by: Araceli Knight on: 07/27/2023 02:56 PM   Modules accepted: Orders

## 2023-07-27 NOTE — Telephone Encounter (Signed)
 Patient informed.

## 2023-07-27 NOTE — Telephone Encounter (Signed)
 Fasting labs ordered to have drawn.

## 2023-07-27 NOTE — Progress Notes (Signed)
 GYNECOLOGY ANNUAL PREVENTATIVE CARE ENCOUNTER NOTE  History:    Rebekah Porter is a 48 y.o. [id number] female here for a routine annual gynecologic exam.    Current complaints: Vaginal odor over the last 3 months and chafing and discomfort underneath both breast. Patient reports changes in medication that may be contributing to vaginal odor, but states that the odor continues after stopping medication. Reports as "vinegary or ammonia -like odor."    Breast Chaffing: an on-going problem that she reports is sporadic and not associated with increased sweating or around the time of her periods. She has attempted a athletes foot cream underneath the breast at night but states minimal relief.   She is not currently sexually active. She currently uses pads and tampons as her collection method and reports slight variation in cycle length due to starting menopause. Denies abnormal vaginal bleeding, discharge, pelvic pain, problems with intercourse or other gynecologic concerns.  Gynecologic History Patient's last menstrual period was 07/22/2023. Contraception: Essure procedure  Last Pap: 01/10/2022. Result was normal with negative HPV Last Mammogram: 01/27/2021.  Result was normal   Obstetric History OB History  Gravida Para Term Preterm AB Living  1 1 1   1   SAB IAB Ectopic Multiple Live Births          # Outcome Date GA Lbr Len/2nd Weight Sex Type Anes PTL Lv  1 Term             Past Medical History:  Diagnosis Date   Obesity     Past Surgical History:  Procedure Laterality Date   ESSURE TUBAL LIGATION      Current Outpatient Medications on File Prior to Visit  Medication Sig Dispense Refill   amphetamine-dextroamphetamine (ADDERALL) 10 MG tablet Take 10 mg by mouth 2 (two) times daily with a meal.     venlafaxine XR (EFFEXOR-XR) 150 MG 24 hr capsule Take 150 mg by mouth daily.     methylphenidate (RITALIN) 5 MG tablet Take 5 mg by mouth 2 (two) times daily. (Patient not  taking: Reported on 07/27/2023)     No current facility-administered medications on file prior to visit.    Allergies  Allergen Reactions   Morphine Hives   Contrave  [Naltrexone -Bupropion  Hcl Er] Other (See Comments)    Starving. Did not help.     Social History:  reports that she has never smoked. She has never used smokeless tobacco. She reports that she does not drink alcohol and does not use drugs.  Family History  Problem Relation Age of Onset   Alcohol abuse Father    Asthma Mother        chronic   Diabetes Paternal Grandmother    Hypertension Paternal Grandmother     The following portions of the patient's history were reviewed and updated as appropriate: allergies, current medications, past family history, past medical history, past social history, past surgical history and problem list.  Review of Systems Pertinent items noted in HPI and remainder of comprehensive ROS otherwise negative.  Physical Exam:  BP 125/89   Pulse 76   Ht 5\' 5"  (1.651 m)   Wt 228 lb (103.4 kg)   LMP 07/22/2023   BMI 37.94 kg/m  CONSTITUTIONAL: Well-developed, well-nourished female in no acute distress.  HENT:  Normocephalic, atraumatic, External right and left ear normal.  EYES: Conjunctivae and EOM are normal. Pupils are equal, round, and reactive to light. No scleral icterus.  NECK: Normal range of motion, supple, no masses observed.  SKIN: Skin is warm and dry. No rash noted. Not diaphoretic. No erythema. No pallor. MUSCULOSKELETAL: Normal range of motion. No tenderness.  No cyanosis, clubbing, or edema. NEUROLOGIC: Alert and oriented to person, place, and time. Normal muscle tone coordination.  PSYCHIATRIC: Normal mood and affect. Normal behavior. Normal judgment and thought content. CARDIOVASCULAR: Normal heart rate noted, regular rhythm RESPIRATORY: Clear to auscultation bilaterally. Effort and breath sounds normal, no problems with respiration noted. BREASTS: Declined ABDOMEN:  Soft, no distention noted.  No tenderness, rebound or guarding.  PELVIC: Normal appearing external genitalia and urethral meatus; normal appearing vaginal mucosa and cervix.  Post-Menses vaginal discharge noted. No odor observed. Performed in the presence of a chaperone.  Assessment and Plan:    1. Encounter for annual routine gynecological examination (Primary) - Patient overall doing well.   2. Normal Pap smear - Next PAP due in 1 year,   3. Screening breast examination - Mammogram scheduled for 08/09/23 - Patient declined breast exam for chaffing d/t seeing her primary MD in 1 week.  - Recommended Corn-Starch underneath the breast to  reduce chaffing.   4. Vaginal odor - Recommended to switch to a vaginal cleanser, without harsh chemicals - Wet prep collected today. - Cervicovaginal ancillary only( [place])  Will follow up results of pap smear and manage accordingly. Normal breast examination today, she was advised to perform periodic self breast examinations.  Mammogram scheduled for breast cancer screening. Routine preventative health maintenance measures emphasized. Please refer to After Visit Summary for other counseling recommendations.      Rebekah Petrak Maurie Southern) Marlys Singh, MSN, CNM  [facility]  07/27/2023 8:52 AM

## 2023-07-30 LAB — CERVICOVAGINAL ANCILLARY ONLY
Bacterial Vaginitis (gardnerella): POSITIVE — AB
Candida Glabrata: NEGATIVE
Candida Vaginitis: NEGATIVE
Chlamydia: NEGATIVE
Comment: NEGATIVE
Comment: NEGATIVE
Comment: NEGATIVE
Comment: NEGATIVE
Comment: NEGATIVE
Comment: NORMAL
Neisseria Gonorrhea: NEGATIVE
Trichomonas: NEGATIVE

## 2023-07-31 ENCOUNTER — Encounter: Payer: Self-pay | Admitting: Certified Nurse Midwife

## 2023-07-31 ENCOUNTER — Other Ambulatory Visit: Payer: Self-pay

## 2023-07-31 DIAGNOSIS — B9689 Other specified bacterial agents as the cause of diseases classified elsewhere: Secondary | ICD-10-CM

## 2023-07-31 MED ORDER — METRONIDAZOLE 500 MG PO TABS: 500.0000 mg | ORAL_TABLET | Freq: Two times a day (BID) | ORAL | 0 refills | Status: DC

## 2023-08-02 ENCOUNTER — Ambulatory Visit: Payer: Self-pay | Admitting: Certified Nurse Midwife

## 2023-08-03 ENCOUNTER — Ambulatory Visit (INDEPENDENT_AMBULATORY_CARE_PROVIDER_SITE_OTHER): Admitting: Physician Assistant

## 2023-08-03 VITALS — BP 132/81 | HR 75 | Ht 65.0 in | Wt 226.4 lb

## 2023-08-03 DIAGNOSIS — Z0000 Encounter for general adult medical examination without abnormal findings: Secondary | ICD-10-CM | POA: Diagnosis not present

## 2023-08-03 DIAGNOSIS — F902 Attention-deficit hyperactivity disorder, combined type: Secondary | ICD-10-CM | POA: Diagnosis not present

## 2023-08-03 DIAGNOSIS — R5383 Other fatigue: Secondary | ICD-10-CM

## 2023-08-03 DIAGNOSIS — K219 Gastro-esophageal reflux disease without esophagitis: Secondary | ICD-10-CM

## 2023-08-03 DIAGNOSIS — F411 Generalized anxiety disorder: Secondary | ICD-10-CM

## 2023-08-03 DIAGNOSIS — Z1211 Encounter for screening for malignant neoplasm of colon: Secondary | ICD-10-CM

## 2023-08-03 DIAGNOSIS — E6609 Other obesity due to excess calories: Secondary | ICD-10-CM

## 2023-08-03 DIAGNOSIS — E785 Hyperlipidemia, unspecified: Secondary | ICD-10-CM | POA: Diagnosis not present

## 2023-08-03 DIAGNOSIS — F439 Reaction to severe stress, unspecified: Secondary | ICD-10-CM

## 2023-08-03 DIAGNOSIS — E66812 Obesity, class 2: Secondary | ICD-10-CM

## 2023-08-03 DIAGNOSIS — Z6837 Body mass index (BMI) 37.0-37.9, adult: Secondary | ICD-10-CM

## 2023-08-03 DIAGNOSIS — Z131 Encounter for screening for diabetes mellitus: Secondary | ICD-10-CM

## 2023-08-03 MED ORDER — OMEPRAZOLE 40 MG PO CPDR: 40.0000 mg | DELAYED_RELEASE_CAPSULE | Freq: Every day | ORAL | 1 refills | Status: DC

## 2023-08-03 NOTE — Progress Notes (Addendum)
 Complete physical exam  Patient: Rebekah Porter   DOB: November 13, 1975   47 y.o. Female  MRN: [id number]  Subjective:     Chief Complaint  Patient presents with   Annual Exam    Rebekah Porter is a 48 y.o. female who presents today for a complete physical exam. She reports consuming a general diet. The patient does not participate in regular exercise at present. She generally feels well. She reports sleeping well. She does not have additional problems to discuss today.    Most recent fall risk assessment:    08/06/2023    8:14 AM  Fall Risk   Falls in the past year? 0  Number falls in past yr: 0  Injury with Fall? 0     Most recent depression screenings:    07/27/2023    8:54 AM 01/19/2021    8:44 AM  PHQ 2/9 Scores  PHQ - 2 Score 3 3  PHQ- 9 Score 8 9    Vision:Within last year and Dental: No current dental problems and Receives regular dental care  Patient Active Problem List   Diagnosis Date Noted   GAD (generalized anxiety disorder) 08/06/2023   Stress 08/06/2023   Gastroesophageal reflux disease 08/06/2023   PND (post-nasal drip) 07/19/2021   Snoring 07/18/2021   Non-restorative sleep 07/18/2021   No energy 07/18/2021   Chronic idiopathic constipation 01/19/2021   Class 2 obesity due to excess calories without serious comorbidity with body mass index (BMI) of 37.0 to 37.9 in adult 01/19/2021   Anxiety 07/30/2020   Status post tubal ligation 09/12/2017   ADHD (attention deficit hyperactivity disorder), combined type 07/20/2016   Seasonal affective disorder (HCC) 04/21/2015   Dyslipidemia (high LDL; low HDL) 04/21/2015   Adjustment disorder with mixed anxiety and depressed mood 06/05/2014   Inattention 08/18/2013   Obesity (BMI 30-39.9) 05/09/2013   Routine general medical examination at a health care facility 06/06/2010   WEIGHT GAIN 01/12/2009   Past Medical History:  Diagnosis Date   Obesity    Past Surgical History:  Procedure Laterality Date    ESSURE TUBAL LIGATION     Allergies  Allergen Reactions   Morphine Hives   Effexor [Venlafaxine]     Did not like the way she felt on it.    Contrave  [Naltrexone -Bupropion  Hcl Er] Other (See Comments)    Starving. Did not help.       Patient Care Team: Rishi Vicario L, PA-C as PCP - General (Family Medicine)   Outpatient Medications Prior to Visit  Medication Sig   amphetamine-dextroamphetamine (ADDERALL) 10 MG tablet Take 10 mg by mouth 2 (two) times daily with a meal.   [DISCONTINUED] methylphenidate (RITALIN) 5 MG tablet Take 5 mg by mouth 2 (two) times daily. (Patient not taking: Reported on 07/27/2023)   [DISCONTINUED] metroNIDAZOLE  (FLAGYL ) 500 MG tablet Take 1 tablet (500 mg total) by mouth 2 (two) times daily for 7 days. (Patient not taking: Reported on 08/03/2023)   [DISCONTINUED] venlafaxine XR (EFFEXOR-XR) 150 MG 24 hr capsule Take 150 mg by mouth daily.   No facility-administered medications prior to visit.    ROS        Objective:     BP 132/81   Pulse 75   Ht 5' 5 (1.651 m)   Wt 226 lb 6.4 oz (102.7 kg)   LMP 07/22/2023   SpO2 94%   BMI 37.67 kg/m  BP Readings from Last 3 Encounters:  08/03/23 132/81  07/27/23 125/89  04/06/22 (!) 143/82   Wt Readings from Last 3 Encounters:  08/03/23 226 lb 6.4 oz (102.7 kg)  07/27/23 228 lb (103.4 kg)  01/10/22 211 lb (95.7 kg)      Physical Exam  BP 132/81   Pulse 75   Ht 5' 5 (1.651 m)   Wt 226 lb 6.4 oz (102.7 kg)   LMP 07/22/2023   SpO2 94%   BMI 37.67 kg/m   General Appearance:    Alert, cooperative, obese no distress, appears stated age  Head:    Normocephalic, without obvious abnormality, atraumatic  Eyes:    PERRL, conjunctiva/corneas clear, EOM's intact, fundi    benign, both eyes  Ears:    Normal TM's and external ear canals, both ears  Nose:   Nares normal, septum midline, mucosa normal, no drainage    or sinus tenderness  Throat:   Lips, mucosa, and tongue normal; teeth and gums normal   Neck:   Supple, symmetrical, trachea midline, no adenopathy;    thyroid:  no enlargement/tenderness/nodules; no carotid   bruit or JVD  Back:     Symmetric, no curvature, ROM normal, no CVA tenderness  Lungs:     Clear to auscultation bilaterally, respirations unlabored  Chest Wall:    No tenderness or deformity   Heart:    Regular rate and rhythm, S1 and S2 normal, no murmur, rub   or gallop     Abdomen:     Soft, non-tender, bowel sounds active all four quadrants,    no masses, no organomegaly        Extremities:   Extremities normal, atraumatic, no cyanosis or edema  Pulses:   2+ and symmetric all extremities  Skin:   Skin color, texture, turgor normal, no rashes or lesions  Lymph nodes:   Cervical, supraclavicular, and axillary nodes normal  Neurologic:   CNII-XII intact, normal strength, sensation and reflexes    throughout       Assessment & Plan:    Routine Health Maintenance and Physical Exam  Immunization History  Administered Date(s) Administered   Moderna Sars-Covid-2 Vaccination 11/28/2020, 12/28/2020   Td 03/13/2006   Tdap 01/19/2021    Health Maintenance  Topic Date Due   Colonoscopy  Never done   MAMMOGRAM  01/26/2022   COVID-19 Vaccine (3 - Moderna risk series) 08/19/2023 (Originally 01/25/2021)   INFLUENZA VACCINE  10/12/2023   Cervical Cancer Screening (HPV/Pap Cotest)  01/11/2027   DTaP/Tdap/Td (3 - Td or Tdap) 01/20/2031   Hepatitis C Screening  Completed   HIV Screening  Completed   HPV VACCINES  Aged Out   Meningococcal B Vaccine  Aged Out    Discussed health benefits of physical activity, and encouraged her to engage in regular exercise appropriate for her age and condition.  SABRAEdra was seen today for annual exam.  Diagnoses and all orders for this visit:  Routine general medical examination at a health care facility -     Cologuard -     B12 and Folate Panel -     CMP14+EGFR -     TSH + free T4 -     VITAMIN D  25 Hydroxy (Vit-D  Deficiency, Fractures) -     CBC w/Diff/Platelet -     Lipid panel  ADHD (attention deficit hyperactivity disorder), combined type  No energy -     Cologuard -     B12 and Folate Panel -     CMP14+EGFR -     TSH +  free T4 -     VITAMIN D  25 Hydroxy (Vit-D Deficiency, Fractures) -     CBC w/Diff/Platelet -     Lipid panel  Dyslipidemia (high LDL; low HDL) -     Lipid panel  Screening for diabetes mellitus -     CMP14+EGFR  GAD (generalized anxiety disorder)  Stress  Class 2 obesity due to excess calories without serious comorbidity with body mass index (BMI) of 37.0 to 37.9 in adult -     B12 and Folate Panel -     CMP14+EGFR -     TSH + free T4 -     VITAMIN D  25 Hydroxy (Vit-D Deficiency, Fractures) -     CBC w/Diff/Platelet -     Lipid panel  Colon cancer screening -     Cologuard  Gastroesophageal reflux disease, unspecified whether esophagitis present -     omeprazole  (PRILOSEC) 40 MG capsule; Take 1 capsule (40 mg total) by mouth daily.   .. Discussed 150 minutes of exercise a week.  Encouraged vitamin D  1000 units and Calcium  1300mg  or 4 servings of dairy a day.  Vitals look good.  PHQ not to goal-declines medication but interested in referral.  Suggested she start ashwaganda 300mg  bid Cologuard to replace colonsocopy Mammo to get results Pap UTD Vaccines UTD  Weight not to goal. Discussed importance of healthy living to get to a BMI under 30.  ? If patient has sleep apnea. Will order sleep study and set her up with CPAP accordingly.   Start omeprazole  daily in the morning for reflux. Follow up as needed. Discussed risk and side effects.   Follow up in 1 year      Vermell Bologna, PA-C

## 2023-08-03 NOTE — Patient Instructions (Addendum)
 Ordered cologuard Get labs Will make referral to [employer] ashwaganda 300mg  twice a day Start omeprazole daily in the morning Work on increasing exercise to weekly  Health Maintenance, Female Adopting a healthy lifestyle and getting preventive care are important in promoting health and wellness. Ask your health care provider about: The right schedule for you to have regular tests and exams. Things you can do on your own to prevent diseases and keep yourself healthy. What should I know about diet, weight, and exercise? Eat a healthy diet  Eat a diet that includes plenty of vegetables, fruits, low-fat dairy products, and lean protein. Do not eat a lot of foods that are high in solid fats, added sugars, or sodium. Maintain a healthy weight Body mass index (BMI) is used to identify weight problems. It estimates body fat based on height and weight. Your health care provider can help determine your BMI and help you achieve or maintain a healthy weight. Get regular exercise Get regular exercise. This is one of the most important things you can do for your health. Most adults should: Exercise for at least 150 minutes each week. The exercise should increase your heart rate and make you sweat (moderate-intensity exercise). Do strengthening exercises at least twice a week. This is in addition to the moderate-intensity exercise. Spend less time sitting. Even light physical activity can be beneficial. Watch cholesterol and blood lipids Have your blood tested for lipids and cholesterol at 47 years of age, then have this test every 5 years. Have your cholesterol levels checked more often if: Your lipid or cholesterol levels are high. You are older than 47 years of age. You are at high risk for heart disease. What should I know about cancer screening? Depending on your health history and family history, you may need to have cancer screening at various ages. This may include  screening for: Breast cancer. Cervical cancer. Colorectal cancer. Skin cancer. Lung cancer. What should I know about heart disease, diabetes, and high blood pressure? Blood pressure and heart disease High blood pressure causes heart disease and increases the risk of stroke. This is more likely to develop in people who have high blood pressure readings or are overweight. Have your blood pressure checked: Every 3-5 years if you are 61-4 years of age. Every year if you are 59 years old or older. Diabetes Have regular diabetes screenings. This checks your fasting blood sugar level. Have the screening done: Once every three years after age 25 if you are at a normal weight and have a low risk for diabetes. More often and at a younger age if you are overweight or have a high risk for diabetes. What should I know about preventing infection? Hepatitis B If you have a higher risk for hepatitis B, you should be screened for this virus. Talk with your health care provider to find out if you are at risk for hepatitis B infection. Hepatitis C Testing is recommended for: Everyone born from [birth year] through 1964. Anyone with known risk factors for hepatitis C. Sexually transmitted infections (STIs) Get screened for STIs, including gonorrhea and chlamydia, if: You are sexually active and are younger than 48 years of age. You are older than 47 years of age and your health care provider tells you that you are at risk for this type of infection. Your sexual activity has changed since you were last screened, and you are at increased risk for chlamydia or gonorrhea. Ask your health care provider if you  are at risk. Ask your health care provider about whether you are at high risk for HIV. Your health care provider may recommend a prescription medicine to help prevent HIV infection. If you choose to take medicine to prevent HIV, you should first get tested for HIV. You should then be tested every 3 months for as  long as you are taking the medicine. Pregnancy If you are about to stop having your period (premenopausal) and you may become pregnant, seek counseling before you get pregnant. Take 400 to 800 micrograms (mcg) of folic acid every day if you become pregnant. Ask for birth control (contraception) if you want to prevent pregnancy. Osteoporosis and menopause Osteoporosis is a disease in which the bones lose minerals and strength with aging. This can result in bone fractures. If you are 52 years old or older, or if you are at risk for osteoporosis and fractures, ask your health care provider if you should: Be screened for bone loss. Take a calcium  or vitamin D  supplement to lower your risk of fractures. Be given hormone replacement therapy (HRT) to treat symptoms of menopause. Follow these instructions at home: Alcohol use Do not drink alcohol if: Your health care provider tells you not to drink. You are pregnant, may be pregnant, or are planning to become pregnant. If you drink alcohol: Limit how much you have to: 0-1 drink a day. Know how much alcohol is in your drink. In the [place] one drink equals one 12 oz bottle of beer (355 mL), one 5 oz glass of wine (148 mL), or one 1 oz glass of hard liquor (44 mL). Lifestyle Do not use any products that contain nicotine or tobacco. These products include cigarettes, chewing tobacco, and vaping devices, such as e-cigarettes. If you need help quitting, ask your health care provider. Do not use street drugs. Do not share needles. Ask your health care provider for help if you need support or information about quitting drugs. General instructions Schedule regular health, dental, and eye exams. Stay current with your vaccines. Tell your health care provider if: You often feel depressed. You have ever been abused or do not feel safe at home. Summary Adopting a healthy lifestyle and getting preventive care are important in promoting health and  wellness. Follow your health care provider's instructions about healthy diet, exercising, and getting tested or screened for diseases. Follow your health care provider's instructions on monitoring your cholesterol and blood pressure. This information is not intended to replace advice given to you by your health care provider. Make sure you discuss any questions you have with your health care provider. Document Revised: 07/19/2020 Document Reviewed: 07/19/2020 Elsevier Patient Education  2024 [employer].

## 2023-08-06 DIAGNOSIS — F439 Reaction to severe stress, unspecified: Secondary | ICD-10-CM | POA: Insufficient documentation

## 2023-08-06 DIAGNOSIS — F411 Generalized anxiety disorder: Secondary | ICD-10-CM | POA: Insufficient documentation

## 2023-08-06 DIAGNOSIS — K219 Gastro-esophageal reflux disease without esophagitis: Secondary | ICD-10-CM | POA: Insufficient documentation

## 2023-08-09 ENCOUNTER — Ambulatory Visit

## 2023-08-09 DIAGNOSIS — Z1231 Encounter for screening mammogram for malignant neoplasm of breast: Secondary | ICD-10-CM | POA: Diagnosis not present

## 2023-08-14 ENCOUNTER — Ambulatory Visit: Payer: Self-pay | Admitting: Physician Assistant

## 2023-08-14 NOTE — Progress Notes (Signed)
 Normal mammogram. Follow up in 1 year.

## 2023-08-21 ENCOUNTER — Ambulatory Visit: Payer: Self-pay | Admitting: Physician Assistant

## 2023-08-21 LAB — COLOGUARD: COLOGUARD: NEGATIVE

## 2023-08-21 NOTE — Progress Notes (Signed)
 Negative cologuard screening. GREAT news. Next screening in 3 years.

## 2023-08-27 ENCOUNTER — Encounter (HOSPITAL_COMMUNITY): Payer: Self-pay | Admitting: Psychiatry

## 2023-08-27 ENCOUNTER — Ambulatory Visit (INDEPENDENT_AMBULATORY_CARE_PROVIDER_SITE_OTHER): Payer: Self-pay | Admitting: Psychiatry

## 2023-08-27 DIAGNOSIS — F332 Major depressive disorder, recurrent severe without psychotic features: Secondary | ICD-10-CM | POA: Diagnosis not present

## 2023-08-27 MED ORDER — BUPROPION HCL ER (SR) 100 MG PO TB12: 100.0000 mg | ORAL_TABLET | Freq: Two times a day (BID) | ORAL | 0 refills | Status: DC

## 2023-08-27 MED ORDER — BUPROPION HCL ER (SR) 100 MG PO TB12: 100.0000 mg | ORAL_TABLET | Freq: Every day | ORAL | 0 refills | Status: DC

## 2023-08-27 NOTE — Progress Notes (Signed)
 Psychiatric Initial Adult Assessment   Patient Identification: Rebekah Porter MRN:  [id number] Date of Evaluation:  08/27/2023 Referral Source: Primary care physician Chief Complaint:   Chief Complaint  Patient presents with   Depression   Establish Care   Visit Diagnosis:    ICD-10-CM   1. Severe episode of recurrent major depressive disorder, without psychotic features (HCC)  F33.2      Virtual Visit via Video Note  I connected with Rebekah Porter on 08/27/23 at 11:00 AM EDT by a video enabled telemedicine application and verified that I am speaking with the correct person using two identifiers.  Location: Patient: home Provider: home office   I discussed the limitations of evaluation and management by telemedicine and the availability of in person appointments. The patient expressed understanding and agreed to proceed.       I discussed the assessment and treatment plan with the patient. The patient was provided an opportunity to ask questions and all were answered. The patient agreed with the plan and demonstrated an understanding of the instructions.   The patient was advised to call back or seek an in-person evaluation if the symptoms worsen or if the condition fails to improve as anticipated.  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  She has a 47 years old son she has been separated since last year August 23 referred by primary care physician establish care for depression.  Patient has had been following with mood center but wants to change provider from mood treatment center states that she has been on put on medication but apparently that did not work.  She gives a history of depression with recent exacerbation because of her  separation states her husband was controlling and she asked for separation but overall in some aspect it has helped but in some aspect mood wise she has been feeling down depressed having crying spell she feels that she had to let go of a lot of financials because her husband had a good company or business now she struggles with finances.  She feels down depressed withdrawn at times decreased energy and disturbed sleep feeling of despair depression has been exacerbated.  There is no associated psychotic symptoms does not endorse hopelessness or suicidal thoughts  She does worry but she feels it was related to finances and separation and she is more concerned about her depression since she is having exacerbation of crying spells.  There is no associated manic symptoms currently in the past  She has been diagnosed with ADD in the past and has been on medication but does not take medication as of now she feels she is concerned about her depression and she understands depression can lead to distraction irritability and difficulty focusing  Past medication used: Effexor, Prozac's that help with PMDD and some depression  Aggravating factors; separation, finances  Modifying factors her son, her faith  In duration 7 to 8 years but more so in the last 6 7 months  Denies prior psychiatry week hospitalization or suicide attempt    Past Psychiatric History: Depression  Previous Psychotropic Medications: Yes   Substance Abuse History in the last 12 months:  No.  Consequences of Substance Abuse: NA  Past Medical History:  Past Medical History:  Diagnosis Date   Obesity  Past Surgical History:  Procedure Laterality Date   ESSURE TUBAL LIGATION      Family Psychiatric History: Grandmother diagnosed with depression  Family History:  Family History  Problem Relation Age of Onset   Alcohol abuse Father    Asthma Mother        chronic   Diabetes Paternal Grandmother    Hypertension  Paternal Grandmother     Social History:   Social History   Socioeconomic History   Marital status: Married    Spouse name: Not on file   Number of children: Not on file   Years of education: Not on file   Highest education level: Not on file  Occupational History   Not on file  Tobacco Use   Smoking status: Never   Smokeless tobacco: Never  Substance and Sexual Activity   Alcohol use: No   Drug use: No   Sexual activity: Not Currently    Birth control/protection: Surgical  Other Topics Concern   Not on file  Social History Narrative   Not on file   Social Drivers of Health   Financial Resource Strain: Low Risk  (06/25/2022)   Received from [facility]   Overall Financial Resource Strain (CARDIA)    Difficulty of Paying Living Expenses: Not hard at all  Food Insecurity: Low Risk  (05/01/2023)   Received from [facility]   Hunger Vital Sign    Within the past 12 months, you worried that your food would run out before you got money to buy more: Never true    Within the past 12 months, the food you bought just didn't last and you didn't have money to get more. : Never true  Transportation Needs: No Transportation Needs (05/01/2023)   Received from [facility]    In the past 12 months, has lack of reliable transportation kept you from medical appointments, meetings, work or from getting things needed for daily living? : No  Physical Activity: Insufficiently Active (06/25/2022)   Received from [facility]   Exercise Vital Sign    On average, how many days per week do you engage in moderate to strenuous exercise (like a brisk walk)?: 3 days    On average, how many minutes do you engage in exercise at this level?: 20 min  Stress: No Stress Concern Present (06/25/2022)   Received from [facility] of Occupational Health - Occupational Stress Questionnaire    Feeling of Stress : Only a little  Social Connections: Socially Integrated  (06/25/2022)   Received from [facility]   Social Network    How would you rate your social network (family, work, friends)?: Good participation with social networks    Additional Social History: Grew up with her dad, her dad's aunt and grandma some challenges growing up in regarding to her grandmother's depression and also some dysfunctional but no physical or sexual trauma  Allergies:   Allergies  Allergen Reactions   Morphine Hives   Effexor [Venlafaxine]     Did not like the way she felt on it.    Contrave  [Naltrexone -Bupropion  Hcl Er] Other (See Comments)    Starving. Did not help.     Metabolic Disorder Labs: No results found for: HGBA1C, MPG No results found for: PROLACTIN Lab Results  Component Value Date   CHOL 228 (H) 01/19/2021   TRIG 149 01/19/2021   HDL 45 (L) 01/19/2021   CHOLHDL 5.1 (H) 01/19/2021   VLDL 31 (H)  07/20/2016   LDLCALC 155 (H) 01/19/2021   LDLCALC 168 (H) 07/20/2016   Lab Results  Component Value Date   TSH 4.08 01/19/2021    Therapeutic Level Labs: No results found for: LITHIUM No results found for: CBMZ No results found for: VALPROATE  Current Medications: Current Outpatient Medications  Medication Sig Dispense Refill   amphetamine-dextroamphetamine (ADDERALL) 10 MG tablet Take 10 mg by mouth 2 (two) times daily with a meal.     buPROPion  ER (WELLBUTRIN  SR) 100 MG 12 hr tablet Take 1 tablet (100 mg total) by mouth daily. 30 tablet 0   omeprazole  (PRILOSEC) 40 MG capsule Take 1 capsule (40 mg total) by mouth daily. 90 capsule 1   No current facility-administered medications for this visit.     Psychiatric Specialty Exam: Review of Systems  Cardiovascular:  Negative for chest pain.  Neurological:  Negative for tremors.  Psychiatric/Behavioral:  Positive for dysphoric mood.     There were no vitals taken for this visit.There is no height or weight on file to calculate BMI.  General Appearance: Casual  Eye Contact:   Fair  Speech:  Normal Rate  Volume:  Normal  Mood:  Anxious and Dysphoric  Affect:  Depressed  Thought Process:  Goal Directed  Orientation:  Full (Time, Place, and Person)  Thought Content:  Rumination  Suicidal Thoughts:  No  Homicidal Thoughts:  No  Memory:  Immediate;   Fair  Judgement:  Fair  Insight:  Fair  Psychomotor Activity:  Normal  Concentration:  Concentration: Fair  Recall:  Fair  Fund of Knowledge:Good  Language: Fair  Akathisia:  No  Handed:    AIMS (if indicated):  not done  Assets:  Communication Skills Desire for Improvement  ADL's:  Intact  Cognition: WNL  Sleep:  Fair   Screenings: GAD-7    Flowsheet Row Office Visit from 07/27/2023 in [facility] for [facility] at [facility] Office Visit from 01/19/2021 in [facility] Primary Care & Sports Medicine at [facility] Video Visit from 07/30/2020 in [facility] Care & Sports Medicine at [facility]  Total GAD-7 Score 3 3 15    PHQ2-9    Flowsheet Row Office Visit from 08/27/2023 in [facility] Outpatient [facility] Health at [facility] Office Visit from 07/27/2023 in [facility] for [facility] at [facility] Office Visit from 01/19/2021 in [facility] Primary Care & Sports Medicine at [facility] Video Visit from 07/30/2020 in [facility] Primary Care & Sports Medicine at [facility] Office Visit from 09/12/2017 in [facility] Primary Care & Sports Medicine at [facility] Total Score 2 3 3 4  0  PHQ-9 Total Score 12 8 9 15  --   Flowsheet Row UC from 04/06/2022 in [facility] Care at [facility] RISK CATEGORY No Risk    Assessment and Plan: as follows  Major depressive disorder recurrent moderate to severe; considering her low energy depression feeling of hopelessness and also distraction with some concern about inattention I will consider Wellbutrin  9 mg increased if  needed discussed the risk and side effects  Highly recommend therapy to work on the separation and guilt as her self-esteem is low since she has been separated in some aspects has been helpful but she is still struggling to cope with it therapy will be helpful and to schedule  Labs requested by her primary care physician and she is following up with them  Highly recommend sleep evaluation rule  out sleep apnea as it can be contributing to her tiredness inattention and she understands to get that evaluated continue to work on sleep hygiene  Follow-up in 2 to 3 weeks or earlier if needed  Collaboration of Care: Primary Care Provider AEB notes and chart reviewed  Patient/Guardian was advised Release of Information must be obtained prior to any record release in order to collaborate their care with an outside provider. Patient/Guardian was advised if they have not already done so to contact the registration department to sign all necessary forms in order for us  to release information regarding their care.   Consent: Patient/Guardian gives verbal consent for treatment and assignment of benefits for services provided during this visit. Patient/Guardian expressed understanding and agreed to proceed.   Wray Heady, MD 6/16/202511:40 AM

## 2023-08-28 ENCOUNTER — Encounter: Payer: Self-pay | Admitting: Physician Assistant

## 2023-08-28 DIAGNOSIS — E66812 Obesity, class 2: Secondary | ICD-10-CM

## 2023-08-28 DIAGNOSIS — G478 Other sleep disorders: Secondary | ICD-10-CM

## 2023-08-28 DIAGNOSIS — F411 Generalized anxiety disorder: Secondary | ICD-10-CM

## 2023-08-28 DIAGNOSIS — R0683 Snoring: Secondary | ICD-10-CM

## 2023-08-28 DIAGNOSIS — R5383 Other fatigue: Secondary | ICD-10-CM

## 2023-08-28 NOTE — Telephone Encounter (Signed)
 Pt said she is okay having a sleep study performed. Prefers to do at home if able.

## 2023-09-08 LAB — CMP14+EGFR
ALT: 13 IU/L (ref 0–32)
AST: 19 IU/L (ref 0–40)
Albumin: 3.9 g/dL (ref 3.9–4.9)
Alkaline Phosphatase: 72 IU/L (ref 44–121)
BUN/Creatinine Ratio: 13 (ref 9–23)
BUN: 12 mg/dL (ref 6–24)
Bilirubin Total: 0.3 mg/dL (ref 0.0–1.2)
CO2: 21 mmol/L (ref 20–29)
Calcium: 9.7 mg/dL (ref 8.7–10.2)
Chloride: 104 mmol/L (ref 96–106)
Creatinine, Ser: 0.93 mg/dL (ref 0.57–1.00)
Globulin, Total: 2.5 g/dL (ref 1.5–4.5)
Glucose: 91 mg/dL (ref 70–99)
Potassium: 5 mmol/L (ref 3.5–5.2)
Sodium: 140 mmol/L (ref 134–144)
Total Protein: 6.4 g/dL (ref 6.0–8.5)
eGFR: 77 mL/min/{1.73_m2} (ref >59)

## 2023-09-08 LAB — CBC WITH DIFFERENTIAL/PLATELET
Basophils Absolute: 0.1 10*3/uL (ref 0.0–0.2)
Basos: 1 %
EOS (ABSOLUTE): 0.1 10*3/uL (ref 0.0–0.4)
Eos: 2 %
Hematocrit: 42 % (ref 34.0–46.6)
Hemoglobin: 13.2 g/dL (ref 11.1–15.9)
Immature Grans (Abs): 0 10*3/uL (ref 0.0–0.1)
Immature Granulocytes: 0 %
Lymphocytes Absolute: 2.5 10*3/uL (ref 0.7–3.1)
Lymphs: 36 %
MCH: 28.1 pg (ref 26.6–33.0)
MCHC: 31.4 g/dL — ABNORMAL LOW (ref 31.5–35.7)
MCV: 90 fL (ref 79–97)
Monocytes Absolute: 0.3 10*3/uL (ref 0.1–0.9)
Monocytes: 5 %
Neutrophils Absolute: 3.9 10*3/uL (ref 1.4–7.0)
Neutrophils: 56 %
Platelets: 390 10*3/uL (ref 150–450)
RBC: 4.69 x10E6/uL (ref 3.77–5.28)
RDW: 13.6 % (ref 11.7–15.4)
WBC: 6.9 10*3/uL (ref 3.4–10.8)

## 2023-09-08 LAB — B12 AND FOLATE PANEL
Folate: 11.7 ng/mL (ref >3.0)
Vitamin B-12: 305 pg/mL (ref 232–1245)

## 2023-09-08 LAB — LIPID PANEL
Chol/HDL Ratio: 6.5 ratio — ABNORMAL HIGH (ref 0.0–4.4)
Cholesterol, Total: 227 mg/dL — ABNORMAL HIGH (ref 100–199)
HDL: 35 mg/dL — ABNORMAL LOW (ref >39)
LDL Chol Calc (NIH): 163 mg/dL — ABNORMAL HIGH (ref 0–99)
Triglycerides: 158 mg/dL — ABNORMAL HIGH (ref 0–149)
VLDL Cholesterol Cal: 29 mg/dL (ref 5–40)

## 2023-09-08 LAB — TSH+FREE T4
Free T4: 1.03 ng/dL (ref 0.82–1.77)
TSH: 2.22 u[IU]/mL (ref 0.450–4.500)

## 2023-09-08 LAB — VITAMIN D 25 HYDROXY (VIT D DEFICIENCY, FRACTURES): Vit D, 25-Hydroxy: 24 ng/mL — ABNORMAL LOW (ref 30.0–100.0)

## 2023-09-10 NOTE — Progress Notes (Signed)
 HI Rhena, I am covering for [facility] while she is out of the office.  Your vitamin D  is low. Recommend start 2000 international daily and recheck in 3 months. Blood count is OK. Your cholesterol is high.  Pleas work on healthy Mediterranean diet and exercise fo r20 min 5 days per week. Metabolic pan, B12, folate and thyroid is normal.   The 10-year ASCVD risk score (Arnett DK, et al., 2019) is: 2.3%   Values used to calculate the score:     Age: 48 years     Clincally relevant sex: Female     Is Non-Hispanic African American: No     Diabetic: No     Tobacco smoker: No     Systolic Blood Pressure: 132 mmHg     Is BP treated: No     HDL Cholesterol: 35 mg/dL     Total Cholesterol: 227 mg/dL

## 2023-09-17 ENCOUNTER — Telehealth (HOSPITAL_COMMUNITY): Admitting: Psychiatry

## 2023-09-19 ENCOUNTER — Telehealth (HOSPITAL_COMMUNITY): Admitting: Psychiatry

## 2023-09-19 ENCOUNTER — Encounter (HOSPITAL_COMMUNITY): Payer: Self-pay | Admitting: Psychiatry

## 2023-09-19 ENCOUNTER — Encounter: Payer: Self-pay | Admitting: Physician Assistant

## 2023-09-19 DIAGNOSIS — F332 Major depressive disorder, recurrent severe without psychotic features: Secondary | ICD-10-CM | POA: Diagnosis not present

## 2023-09-19 MED ORDER — FLUOXETINE HCL 10 MG PO CAPS: 10.0000 mg | ORAL_CAPSULE | Freq: Every day | ORAL | 2 refills | Status: DC

## 2023-09-19 MED ORDER — ATORVASTATIN CALCIUM 20 MG PO TABS: 20.0000 mg | ORAL_TABLET | Freq: Every day | ORAL | 3 refills | Status: AC

## 2023-09-19 MED ORDER — BUPROPION HCL ER (XL) 300 MG PO TB24: 300.0000 mg | ORAL_TABLET | ORAL | 0 refills | Status: DC

## 2023-09-19 MED ORDER — VITAMIN D (ERGOCALCIFEROL) 1.25 MG (50000 UNIT) PO CAPS: 50000.0000 [IU] | ORAL_CAPSULE | ORAL | 0 refills | Status: AC

## 2023-09-19 NOTE — Progress Notes (Signed)
 [facility] Follow up visit  Patient Identification: Rebekah Porter MRN:  [id number] Date of Evaluation:  09/19/2023 Referral Source: Primary care physician Chief Complaint:   No chief complaint on file. Follow up depression  Visit Diagnosis:    ICD-10-CM   1. Severe episode of recurrent major depressive disorder, without psychotic features (HCC)  F33.2     Virtual Visit via Video Note  I connected with Rebekah Porter on 09/19/23 at 10:00 AM EDT by a video enabled telemedicine application and verified that I am speaking with the correct person using two identifiers.  Location: Patient: home Provider: home office    I discussed the limitations of evaluation and management by telemedicine and the availability of in person appointments. The patient expressed understanding and agreed to proceed.  I discussed the assessment and treatment plan with the patient. The patient was provided an opportunity to ask questions and all were answered. The patient agreed with the plan and demonstrated an understanding of the instructions.   The patient was advised to call back or seek an in-person evaluation if the symptoms worsen or if the condition fails to improve as anticipated.  I provided 18 minutes of non-face-to-face time during this encounter.           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  She has a 64 years old son she has been separated since last year August 23 referred initially by primary care physician establish care for depression.  Patient has had been following with mood center but wanted to change provider from mood treatment center   Past medication used: Effexor, Prozac 's that help with PMDD and some depression   Last visit we had started back Wellbutrin  since she has had a past good response on Wellbutrin  combined with Prozac  but not on the Effexor.  She has noticed  some improvement still feels subdued and tiredness she is moving forward with the divorce and is currently working full-time  We talked about other aspects of fatigue including rule out sleep apnea, discussed with primary care physician regarding to her LDL and regularizing her sleep-wake cycle   Aggravating factors; separation, finances  Modifying factors her son, her faith  In duration 7 to 8 years but more so in the last 6 7 months  Denies prior psychiatry week hospitalization or suicide attempt  Severity endorses subdued mood tiredness  Past Psychiatric History: Depression  Previous Psychotropic Medications: Yes   Substance Abuse History in the last 12 months:  No.  Consequences of Substance Abuse: NA  Past Medical History:  Past Medical History:  Diagnosis Date   Obesity     Past Surgical History:  Procedure Laterality Date   ESSURE TUBAL LIGATION      Family Psychiatric History: Grandmother diagnosed with depression  Family History:  Family History  Problem Relation Age of Onset   Alcohol abuse Father    Asthma Mother        chronic   Diabetes Paternal Grandmother    Hypertension Paternal Grandmother     Social History:   Social History   Socioeconomic History   Marital status: Married    Spouse name: Not on file   Number of children: Not on file   Years of education: Not on file   Highest education level: Not on file  Occupational History   Not on file  Tobacco Use   Smoking  status: Never   Smokeless tobacco: Never  Substance and Sexual Activity   Alcohol use: No   Drug use: No   Sexual activity: Not Currently    Birth control/protection: Surgical  Other Topics Concern   Not on file  Social History Narrative   Not on file   Social Drivers of Health   Financial Resource Strain: Low Risk  (06/25/2022)   [facility]   Overall Financial Resource Strain (CARDIA)    Difficulty of Paying Living Expenses: Not hard at all  Food  Insecurity: Low Risk  (05/01/2023)   Received from [facility]   Hunger Vital Sign    Within the past 12 months, you worried that your food would run out before you got money to buy more: Never true    Within the past 12 months, the food you bought just didn't last and you didn't have money to get more. : Never true  Transportation Needs: No Transportation Needs (05/01/2023)   Received from [facility]    In the past 12 months, has lack of reliable transportation kept you from medical appointments, meetings, work or from getting things needed for daily living? : No  Physical Activity: Insufficiently Active (06/25/2022)   Received from [facility]   Exercise Vital Sign    On average, how many days per week do you engage in moderate to strenuous exercise (like a brisk walk)?: 3 days    On average, how many minutes do you engage in exercise at this level?: 20 min  Stress: No Stress Concern Present (06/25/2022)   Received from [facility] of Occupational Health - Occupational Stress Questionnaire    Feeling of Stress : Only a little  Social Connections: Socially Integrated (06/25/2022)   Received from [facility]   Social Network    How would you rate your social network (family, work, friends)?: Good participation with social networks    Allergies:   Allergies  Allergen Reactions   Morphine Hives   Effexor [Venlafaxine]     Did not like the way she felt on it.    Contrave  [Naltrexone -Bupropion  Hcl Er] Other (See Comments)    Starving. Did not help.     Metabolic Disorder Labs: No results found for: HGBA1C, MPG No results found for: PROLACTIN Lab Results  Component Value Date   CHOL 227 (H) 09/07/2023   TRIG 158 (H) 09/07/2023   HDL 35 (L) 09/07/2023   CHOLHDL 6.5 (H) 09/07/2023   VLDL 31 (H) 07/20/2016   LDLCALC 163 (H) 09/07/2023   LDLCALC 155 (H) 01/19/2021   Lab Results  Component Value Date   TSH 2.220 09/07/2023     Therapeutic Level Labs: No results found for: LITHIUM No results found for: CBMZ No results found for: VALPROATE  Current Medications: Current Outpatient Medications  Medication Sig Dispense Refill   buPROPion  (WELLBUTRIN  XL) 300 MG 24 hr tablet Take 1 tablet (300 mg total) by mouth every morning. 30 tablet 0   FLUoxetine  (PROZAC ) 10 MG capsule Take 1 capsule (10 mg total) by mouth daily. 30 capsule 2   amphetamine-dextroamphetamine (ADDERALL) 10 MG tablet Take 10 mg by mouth 2 (two) times daily with a meal.     buPROPion  ER (WELLBUTRIN  SR) 100 MG 12 hr tablet Take 1 tablet (100 mg total) by mouth daily. 30 tablet 0   omeprazole  (PRILOSEC) 40 MG capsule Take 1 capsule (40 mg total) by mouth daily. 90 capsule 1  No current facility-administered medications for this visit.     Psychiatric Specialty Exam: Review of Systems  Cardiovascular:  Negative for chest pain.  Neurological:  Negative for tremors.  Psychiatric/Behavioral:  Positive for dysphoric mood.     There were no vitals taken for this visit.There is no height or weight on file to calculate BMI.  General Appearance: Casual  Eye Contact:  Fair  Speech:  Normal Rate  Volume:  Normal  Mood: Subdued  Affect:  Depressed  Thought Process:  Goal Directed  Orientation:  Full (Time, Place, and Person)  Thought Content:  Rumination  Suicidal Thoughts:  No  Homicidal Thoughts:  No  Memory:  Immediate;   Fair  Judgement:  Fair  Insight:  Fair  Psychomotor Activity:  Normal  Concentration:  Concentration: Fair  Recall:  Fair  Fund of Knowledge:Good  Language: Fair  Akathisia:  No  Handed:    AIMS (if indicated):  not done  Assets:  Communication Skills Desire for Improvement  ADL's:  Intact  Cognition: WNL  Sleep:  Fair   Screenings: GAD-7    Flowsheet Row Office Visit from 07/27/2023 in [facility] for [facility] at [employer] Office Visit from 01/19/2021 in [facility] Primary  Care & Sports Medicine at [facility] Video Visit from 07/30/2020 in [facility] Primary Care & [facility] at [facility]  Total GAD-7 Score 3 3 15    PHQ2-9    Flowsheet Row Office Visit from 08/27/2023 in [facility] Health Outpatient Behavioral Health at [facility] Office Visit from 07/27/2023 in [facility] for [facility] Healthcare at [employer] Office Visit from 01/19/2021 in [facility] Primary Care & Sports Medicine at [facility] Video Visit from 07/30/2020 in [facility] Primary Care & Sports Medicine at [facility] Office Visit from 09/12/2017 in [facility] Primary Care & Sports Medicine at [facility] Total Score 2 3 3 4  0  PHQ-9 Total Score 12 8 9 15  --   Flowsheet Row UC from 04/06/2022 in [facility] Urgent Care at [facility] RISK CATEGORY No Risk    Assessment and Plan: as follows  Prior documentation reviewed  Major depressive disorder recurrent moderate to severe; subdued increase Wellbutrin  to 150 mg also added Prozac  10 mg she has done better on this to combination with a higher dose of Prozac  in the past   Patient to rule out sleep apnea also evaluate her labs with her primary care physician and add activities to keep her motivated   Also recommend therapy to work on self-esteem and depression  Labs reviewed follow-up with primary care physician also to assess her vitamin D  by primary care physician  Follow-up in 4 weeks or earlier if needed  Collaboration of Care: Primary Care Provider AEB notes and chart reviewed  Patient/Guardian was advised Release of Information must be obtained prior to any record release in order to collaborate their care with an outside provider. Patient/Guardian was advised if they have not already done so to contact the registration department to sign all necessary forms in order for us  to release information regarding their care.   Consent:  Patient/Guardian gives verbal consent for treatment and assignment of benefits for services provided during this visit. Patient/Guardian expressed understanding and agreed to proceed.   Jackey Flight, MD 7/9/202510:34 AM

## 2023-10-10 ENCOUNTER — Encounter (HOSPITAL_COMMUNITY): Payer: Self-pay

## 2023-10-10 ENCOUNTER — Ambulatory Visit (INDEPENDENT_AMBULATORY_CARE_PROVIDER_SITE_OTHER): Payer: Self-pay | Admitting: Licensed Clinical Social Worker

## 2023-10-10 DIAGNOSIS — Z91199 Patient's noncompliance with other medical treatment and regimen due to unspecified reason: Secondary | ICD-10-CM

## 2023-10-10 NOTE — Progress Notes (Signed)
 Patient did not show for assessment

## 2023-10-17 ENCOUNTER — Other Ambulatory Visit: Payer: Self-pay

## 2023-10-17 ENCOUNTER — Encounter: Payer: Self-pay | Admitting: Physician Assistant

## 2023-10-17 DIAGNOSIS — G478 Other sleep disorders: Secondary | ICD-10-CM

## 2023-10-17 DIAGNOSIS — R0683 Snoring: Secondary | ICD-10-CM

## 2023-10-17 DIAGNOSIS — G4733 Obstructive sleep apnea (adult) (pediatric): Secondary | ICD-10-CM | POA: Insufficient documentation

## 2023-10-18 ENCOUNTER — Telehealth: Payer: Self-pay

## 2023-10-18 ENCOUNTER — Encounter: Payer: Self-pay | Admitting: Physician Assistant

## 2023-10-18 NOTE — Telephone Encounter (Signed)
 [place], Vermell CROME, PA-C  [place], LPN Sleep study showed mild sleep apnea with 78 apnea events per hour. It was suggested to start CPAP machine. I have signed ordered and sent out to [facility] medical which should be getting your machine shipped out to you! You will follow up about 6 weeks after being on CPaP.

## 2023-10-24 ENCOUNTER — Encounter (HOSPITAL_COMMUNITY): Payer: Self-pay | Admitting: Psychiatry

## 2023-10-24 ENCOUNTER — Telehealth (HOSPITAL_COMMUNITY): Admitting: Psychiatry

## 2023-10-24 DIAGNOSIS — R5383 Other fatigue: Secondary | ICD-10-CM

## 2023-10-24 DIAGNOSIS — F332 Major depressive disorder, recurrent severe without psychotic features: Secondary | ICD-10-CM | POA: Diagnosis not present

## 2023-10-24 MED ORDER — FLUOXETINE HCL 10 MG PO CAPS: 10.0000 mg | ORAL_CAPSULE | Freq: Every day | ORAL | 2 refills | Status: DC

## 2023-10-24 MED ORDER — BUPROPION HCL ER (XL) 300 MG PO TB24: 300.0000 mg | ORAL_TABLET | ORAL | 0 refills | Status: DC

## 2023-10-24 NOTE — Progress Notes (Signed)
 [facility] Follow up visit  Patient Identification: Rebekah Porter MRN:  [id number] Date of Evaluation:  10/24/2023 Referral Source: Primary care physician Chief Complaint:   No chief complaint on file. Follow up depression  Visit Diagnosis:    ICD-10-CM   1. Severe episode of recurrent major depressive disorder, without psychotic features (HCC)  F33.2     2. Tiredness  R53.83     Virtual Visit via Video Note  I connected with Rebekah Porter on 10/24/23 at 11:00 AM EDT by a video enabled telemedicine application and verified that I am speaking with the correct person using two identifiers.  Location: Patient: home Provider: home office   I discussed the limitations of evaluation and management by telemedicine and the availability of in person appointments. The patient expressed understanding and agreed to proceed.      I discussed the assessment and treatment plan with the patient. The patient was provided an opportunity to ask questions and all were answered. The patient agreed with the plan and demonstrated an understanding of the instructions.   The patient was advised to call back or seek an in-person evaluation if the symptoms worsen or if the condition fails to improve as anticipated.  I provided 16 minutes of non-face-to-face time during this encounter.     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  She has a 2 years old son she has been separated since last year August 23 referred initially by primary care physician establish care for depression.  On evaluation doing reasonable she still has some PMDD symptoms but overall Prozac  at 10 mg and Wellbutrin  300 mg is working fair.  She still has some tiredness but is getting evaluated for possible sleep apnea we will wait for the results  She may look for another job for financial reasons other than that she likes her current  job and handling her stress moving forward with the divorce  Aggravating factors; separation, finances  Modifying factors her son, her faith  In duration 7 to 8 years but more so in the last 6 7 months  Denies prior psychiatry week hospitalization or suicide attempt  Severity endorses subdued mood tiredness  Past Psychiatric History: Depression  Previous Psychotropic Medications: Yes   Substance Abuse History in the last 12 months:  No.  Consequences of Substance Abuse: NA  Past Medical History:  Past Medical History:  Diagnosis Date   Obesity     Past Surgical History:  Procedure Laterality Date   ESSURE TUBAL LIGATION      Family Psychiatric History: Grandmother diagnosed with depression  Family History:  Family History  Problem Relation Age of Onset   Alcohol abuse Father    Asthma Mother        chronic   Diabetes Paternal Grandmother    Hypertension Paternal Grandmother     Social History:   Social History   Socioeconomic History   Marital status: Married    Spouse name: Not on file   Number of children: Not on file   Years of education: Not on file   Highest education level: Not on file  Occupational History   Not on file  Tobacco Use   Smoking status: Never   Smokeless tobacco: Never  Substance and Sexual Activity   Alcohol use: No   Drug use: No   Sexual activity: Not Currently    Birth control/protection: Surgical  Other Topics Concern   Not on file  Social History Narrative   Not on file   Social Drivers of Health   Financial Resource Strain: Low Risk  (06/25/2022)   Received from [facility]   Overall Financial Resource Strain (CARDIA)    Difficulty of Paying Living Expenses: Not hard at all  Food Insecurity: Low Risk  (05/01/2023)   Received from [facility]   Hunger Vital Sign    Within the past 12 months, you worried that your food would run out before you got money to buy more: Never true    Within the past 12 months, the  food you bought just didn't last and you didn't have money to get more. : Never true  Transportation Needs: No Transportation Needs (05/01/2023)   Received from [facility]    In the past 12 months, has lack of reliable transportation kept you from medical appointments, meetings, work or from getting things needed for daily living? : No  Physical Activity: Insufficiently Active (06/25/2022)   Received from [facility]   Exercise Vital Sign    On average, how many days per week do you engage in moderate to strenuous exercise (like a brisk walk)?: 3 days    On average, how many minutes do you engage in exercise at this level?: 20 min  Stress: No Stress Concern Present (06/25/2022)   Received from [facility] - Occupational Stress Questionnaire    Feeling of Stress : Only a little  Social Connections: Socially Integrated (06/25/2022)   Received from [facility]   Social Network    How would you rate your social network (family, work, friends)?: Good participation with social networks    Allergies:   Allergies  Allergen Reactions   Morphine Hives   Effexor [Venlafaxine]     Did not like the way she felt on it.    Contrave  [Naltrexone -Bupropion  Hcl Er] Other (See Comments)    Starving. Did not help.     Metabolic Disorder Labs: No results found for: HGBA1C, MPG No results found for: PROLACTIN Lab Results  Component Value Date   CHOL 227 (H) 09/07/2023   TRIG 158 (H) 09/07/2023   HDL 35 (L) 09/07/2023   CHOLHDL 6.5 (H) 09/07/2023   VLDL 31 (H) 07/20/2016   LDLCALC 163 (H) 09/07/2023   LDLCALC 155 (H) 01/19/2021   Lab Results  Component Value Date   TSH 2.220 09/07/2023    Therapeutic Level Labs: No results found for: LITHIUM No results found for: CBMZ No results found for: VALPROATE  Current Medications: Current Outpatient Medications  Medication Sig Dispense Refill   atorvastatin  (LIPITOR)  20 MG tablet Take 1 tablet (20 mg total) by mouth at bedtime. 90 tablet 3   buPROPion  (WELLBUTRIN  XL) 300 MG 24 hr tablet Take 1 tablet (300 mg total) by mouth every morning. 30 tablet 0   FLUoxetine  (PROZAC ) 10 MG capsule Take 1 capsule (10 mg total) by mouth daily. 30 capsule 2   Vitamin D , Ergocalciferol , (DRISDOL ) 1.25 MG (50000 UNIT) CAPS capsule Take 1 capsule (50,000 Units total) by mouth every 7 (seven) days. Take for 8 total doses(weeks) 8 capsule 0   No current facility-administered medications for this visit.     Psychiatric Specialty Exam: Review of Systems  Cardiovascular:  Negative for chest pain.  Neurological:  Negative for tremors.  Psychiatric/Behavioral:  Positive for dysphoric mood.     There were no  vitals taken for this visit.There is no height or weight on file to calculate BMI.  General Appearance: Casual  Eye Contact:  Fair  Speech:  Normal Rate  Volume:  Normal  Mood: Better  Affect:  Depressed  Thought Process:  Goal Directed  Orientation:  Full (Time, Place, and Person)  Thought Content:  Rumination  Suicidal Thoughts:  No  Homicidal Thoughts:  No  Memory:  Immediate;   Fair  Judgement:  Fair  Insight:  Fair  Psychomotor Activity:  Normal  Concentration:  Concentration: Fair  Recall:  Fair  Fund of Knowledge:Good  Language: Fair  Akathisia:  No  Handed:    AIMS (if indicated):  not done  Assets:  Communication Skills Desire for Improvement  ADL's:  Intact  Cognition: WNL  Sleep:  Fair   Screenings: GAD-7    Flowsheet Row Office Visit from 07/27/2023 in [facility] for [facility] at [employer] Office Visit from 01/19/2021 in [facility] Primary Care & Sports Medicine at [facility] Video Visit from 07/30/2020 in [facility] Primary Care & Sports Medicine at [facility]  Total GAD-7 Score 3 3 15    PHQ2-9    Flowsheet Row Office Visit from 08/27/2023 in [facility] Outpatient [facility]al Health at  [facility] Office Visit from 07/27/2023 in [facility] for [facility] at [employer] Office Visit from 01/19/2021 in [facility] Primary Care & Sports Medicine at [facility] Video Visit from 07/30/2020 in [facility] Primary Care & Sports Medicine at [facility] Office Visit from 09/12/2017 in [facility] Primary Care & Sports Medicine at [facility] Total Score 2 3 3 4  0  PHQ-9 Total Score 12 8 9 15  --   Flowsheet Row UC from 04/06/2022 in [facility] at [facility] RISK CATEGORY No Risk    Assessment and Plan: as follows Prior documentation reviewed  Major depressive disorder recurrent moderate to severe; improved she is on Wellbutrin  3 mg and Prozac  is 10 mg she can consider higher dose if needed but overall managing things reasonable  Tiredness; Patient to rule out sleep apnea she has got a sleep study done showing mild to moderate and is pending evaluation with a CPAP machine  Continue work on sleep hygiene for now follow-up in 3 to 4 months or earlier if needed    Collaboration of Care: Primary Care Provider AEB notes and chart reviewed  Patient/Guardian was advised Release of Information must be obtained prior to any record release in order to collaborate their care with an outside provider. Patient/Guardian was advised if they have not already done so to contact the registration department to sign all necessary forms in order for us  to release information regarding their care.   Consent: Patient/Guardian gives verbal consent for treatment and assignment of benefits for services provided during this visit. Patient/Guardian expressed understanding and agreed to proceed.   Jackey Flight, MD 8/13/202511:06 AM

## 2023-10-25 NOTE — Telephone Encounter (Signed)
 CPAP machine orders, clinical notes, home sleep study report, demographics and copies of insurance cards have been faxed to [facility] at [phone].

## 2023-10-29 NOTE — Telephone Encounter (Signed)
 Last read by [name] at 11:14AM on 10/25/2023.

## 2023-11-08 ENCOUNTER — Telehealth: Payer: Self-pay | Admitting: Physician Assistant

## 2023-11-08 NOTE — Telephone Encounter (Signed)
 Jodie from [facility] is requesting previous an addendum to clinical notes on 08/03/23  in order to get CPAP machine approved. Get you please add sleep study ordered to the notes. Please advise.

## 2023-11-09 NOTE — Telephone Encounter (Signed)
 Done

## 2023-11-20 NOTE — Telephone Encounter (Signed)
 Can we refax this to [phone]?

## 2023-11-20 NOTE — Telephone Encounter (Signed)
 Amended notes have been faxed to [facility] at [phone].

## 2023-11-26 NOTE — Telephone Encounter (Signed)
 Amended notes have been re-faxed to [facility] at [phone] on 11/20/2023 and 11/26/2023.

## 2023-11-28 ENCOUNTER — Encounter: Payer: Self-pay | Admitting: Physician Assistant

## 2023-12-17 ENCOUNTER — Telehealth (HOSPITAL_COMMUNITY): Payer: Self-pay

## 2023-12-17 MED ORDER — BUPROPION HCL ER (XL) 300 MG PO TB24: 300.0000 mg | ORAL_TABLET | ORAL | 0 refills | Status: DC

## 2023-12-17 NOTE — Telephone Encounter (Signed)
 received fax requesting a refill on the bupropion  xl 300mg . pt was last seen on 8-13 next appt 12-10

## 2024-02-20 ENCOUNTER — Telehealth (INDEPENDENT_AMBULATORY_CARE_PROVIDER_SITE_OTHER): Admitting: Psychiatry

## 2024-02-20 ENCOUNTER — Encounter (HOSPITAL_COMMUNITY): Payer: Self-pay | Admitting: Psychiatry

## 2024-02-20 DIAGNOSIS — F332 Major depressive disorder, recurrent severe without psychotic features: Secondary | ICD-10-CM

## 2024-02-20 DIAGNOSIS — R5383 Other fatigue: Secondary | ICD-10-CM

## 2024-02-20 MED ORDER — BUPROPION HCL ER (XL) 300 MG PO TB24: 300.0000 mg | ORAL_TABLET | ORAL | 0 refills | Status: DC

## 2024-02-20 MED ORDER — FLUOXETINE HCL 10 MG PO CAPS: 10.0000 mg | ORAL_CAPSULE | Freq: Every day | ORAL | 2 refills | Status: DC

## 2024-02-20 MED ORDER — BUPROPION HCL ER (XL) 300 MG PO TB24: 300.0000 mg | ORAL_TABLET | ORAL | 2 refills | Status: DC

## 2024-02-20 MED ORDER — FLUOXETINE HCL 10 MG PO CAPS: 10.0000 mg | ORAL_CAPSULE | Freq: Every day | ORAL | 2 refills | Status: AC

## 2024-02-20 NOTE — Progress Notes (Signed)
 [facility] Follow up visit  Patient Identification: THEDA PAYER MRN:  [id number] Date of Evaluation:  02/20/2024 Referral Source: Primary care physician Chief Complaint:   No chief complaint on file. Follow up depression  Visit Diagnosis:    ICD-10-CM   1. Severe episode of recurrent major depressive disorder, without psychotic features (HCC)  F33.2     2. Tiredness  R53.83     Virtual Visit via Video Note  I connected with [name] on 02/20/24 at 10:00 AM EST by a video enabled telemedicine application and verified that I am speaking with the correct person using two identifiers.  Location: Patient: home Provider: home office   I discussed the limitations of evaluation and management by telemedicine and the availability of in person appointments. The patient expressed understanding and agreed to proceed.      I discussed the assessment and treatment plan with the patient. The patient was provided an opportunity to ask questions and all were answered. The patient agreed with the plan and demonstrated an understanding of the instructions.   The patient was advised to call back or seek an in-person evaluation if the symptoms worsen or if the condition fails to improve as anticipated.  I provided 15 minutes of non-face-to-face time during this encounter.      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  She has a 47 years old son she has been separated since 2023, August 23 referred initially by primary care physician establish care for depression.  On evaluation remains stable tolerating medication PMDD symptoms are improved she is tolerating Wellbutrin  denies having any allergies to that and has been taking it.  Handling finances and moving forward from the divorce  Aggravating factors; separation, finances  Modifying factors her son, her faith  In duration 7 to 8  years but more so in the last 6 7 months  Denies prior psychiatry week hospitalization or suicide attempt  Severity endorses subdued mood tiredness  Past Psychiatric History: Depression  Previous Psychotropic Medications: Yes   Substance Abuse History in the last 12 months:  No.  Consequences of Substance Abuse: NA  Past Medical History:  Past Medical History:  Diagnosis Date   Obesity     Past Surgical History:  Procedure Laterality Date   ESSURE TUBAL LIGATION      Family Psychiatric History: Grandmother diagnosed with depression  Family History:  Family History  Problem Relation Age of Onset   Alcohol abuse Father    Asthma Mother        chronic   Diabetes Paternal Grandmother    Hypertension Paternal Grandmother     Social History:   Social History   Socioeconomic History   Marital status: Married    Spouse name: Not on file   Number of children: Not on file   Years of education: Not on file   Highest education level: Not on file  Occupational History   Not on file  Tobacco Use   Smoking status: Never   Smokeless tobacco: Never  Substance and Sexual Activity   Alcohol use: No   Drug use: No   Sexual activity: Not Currently    Birth control/protection: Surgical  Other Topics Concern   Not on file  Social History Narrative   Not on file   Social Drivers of Health   Financial Resource Strain: Low Risk (06/25/2022)   Received from  [facility]   Overall Financial Resource Strain (CARDIA)    Difficulty of Paying Living Expenses: Not hard at all  Food Insecurity: Low Risk (05/01/2023)   Received from [facility]   Hunger Vital Sign    Within the past 12 months, you worried that your food would run out before you got money to buy more: Never true    Within the past 12 months, the food you bought just didn't last and you didn't have money to get more. : Never true  Transportation Needs: No Transportation Needs (05/01/2023)   Received from [facility]    In the past 12 months, has lack of reliable transportation kept you from medical appointments, meetings, work or from getting things needed for daily living? : No  Physical Activity: Insufficiently Active (06/25/2022)   Received from [facility]   Exercise Vital Sign    On average, how many days per week do you engage in moderate to strenuous exercise (like a brisk walk)?: 3 days    On average, how many minutes do you engage in exercise at this level?: 20 min  Stress: No Stress Concern Present (06/25/2022)   Received from [facility] - Occupational Stress Questionnaire    Feeling of Stress : Only a little  Social Connections: Socially Integrated (06/25/2022)   Received from [facility]   Social Network    How would you rate your social network (family, work, friends)?: Good participation with social networks    Allergies:   Allergies  Allergen Reactions   Morphine Hives   Effexor [Venlafaxine]     Did not like the way she felt on it.    Contrave  [Naltrexone -Bupropion  Hcl Er] Other (See Comments)    Starving. Did not help.     Metabolic Disorder Labs: No results found for: HGBA1C, MPG No results found for: PROLACTIN Lab Results  Component Value Date   CHOL 227 (H) 09/07/2023   TRIG 158 (H) 09/07/2023   HDL 35 (L) 09/07/2023   CHOLHDL 6.5 (H) 09/07/2023   VLDL 31 (H) 07/20/2016   LDLCALC 163 (H) 09/07/2023   LDLCALC 155 (H) 01/19/2021   Lab Results  Component Value Date   TSH 2.220 09/07/2023    Therapeutic Level Labs: No results found for: LITHIUM No results found for: CBMZ No results found for: VALPROATE  Current Medications: Current Outpatient Medications  Medication Sig Dispense Refill   atorvastatin  (LIPITOR) 20 MG tablet Take 1 tablet (20 mg total) by mouth at bedtime. 90 tablet 3   buPROPion  (WELLBUTRIN  XL) 300 MG 24 hr tablet Take 1 tablet (300 mg total) by mouth every  morning. 30 tablet 2   FLUoxetine  (PROZAC ) 10 MG capsule Take 1 capsule (10 mg total) by mouth daily. 30 capsule 2   Vitamin D , Ergocalciferol , (DRISDOL ) 1.25 MG (50000 UNIT) CAPS capsule Take 1 capsule (50,000 Units total) by mouth every 7 (seven) days. Take for 8 total doses(weeks) 8 capsule 0   No current facility-administered medications for this visit.     Psychiatric Specialty Exam: Review of Systems  Cardiovascular:  Negative for chest pain.  Neurological:  Negative for tremors.    There were no vitals taken for this visit.There is no height or weight on file to calculate BMI.  General Appearance: Casual  Eye Contact:  Fair  Speech:  Normal Rate  Volume:  Normal  Mood: Better  Affect:  Depressed  Thought Process:  Goal  Directed  Orientation:  Full (Time, Place, and Person)  Thought Content:  Rumination  Suicidal Thoughts:  No  Homicidal Thoughts:  No  Memory:  Immediate;   Fair  Judgement:  Fair  Insight:  Fair  Psychomotor Activity:  Normal  Concentration:  Concentration: Fair  Recall:  Fair  Fund of Knowledge:Good  Language: Fair  Akathisia:  No  Handed:    AIMS (if indicated):  not done  Assets:  Communication Skills Desire for Improvement  ADL's:  Intact  Cognition: WNL  Sleep:  Fair   Screenings: GAD-7    Flowsheet Row Office Visit from 07/27/2023 in [facility] for [facility] at [employer] Office Visit from 01/19/2021 in [facility] Primary Care & Sports Medicine at [facility] Video Visit from 07/30/2020 in [facility] Primary Care & Sports Medicine at [facility]  Total GAD-7 Score 3 3 15    PHQ2-9    Flowsheet Row Office Visit from 08/27/2023 in [facility] Health Outpatient Behavioral Health at [facility] Office Visit from 07/27/2023 in [facility] for [facility] Healthcare at [employer] Office Visit from 01/19/2021 in [facility] Medicine at [facility]  Video Visit from 07/30/2020 in [facility] Primary Care & Sports Medicine at [facility] Office Visit from 09/12/2017 in [facility] Primary Care & Sports Medicine at [facility] Total Score 2 3 3 4  0  PHQ-9 Total Score 12 8 9 15  --   Flowsheet Row UC from 04/06/2022 in [facility] Urgent Care at [facility] RISK CATEGORY No Risk    Assessment and Plan: as follows Prior documentation reviewed  Major depressive disorder recurrent moderate to severe; stable continue Wellbutrin  300 mg and Prozac    Tiredness; describes sleep to be adequate and also tiredness is improved she may look into possible rule out sleep apnea if needed  Follow-up in 5 to 6 months reviewed medication questions addressed call back for any concern or urgent reason.  I will 911 or emergency need for any decompensation or suicidal thoughts   Collaboration of Care: Primary Care Provider AEB notes and chart reviewed  Patient/Guardian was advised Release of Information must be obtained prior to any record release in order to collaborate their care with an outside provider. Patient/Guardian was advised if they have not already done so to contact the registration department to sign all necessary forms in order for us  to release information regarding their care.   Consent: Patient/Guardian gives verbal consent for treatment and assignment of benefits for services provided during this visit. Patient/Guardian expressed understanding and agreed to proceed.   Jackey Flight, MD 12/10/202510:02 AM

## 2024-02-29 ENCOUNTER — Telehealth: Payer: Self-pay

## 2024-02-29 MED ORDER — BUPROPION HCL ER (XL) 300 MG PO TB24: 300.0000 mg | ORAL_TABLET | ORAL | 1 refills | Status: AC

## 2024-02-29 NOTE — Addendum Note (Signed)
 Addended by: GERALENE KAISER on: 02/29/2024 10:49 AM   Modules accepted: Orders

## 2024-02-29 NOTE — Telephone Encounter (Signed)
 Patient called stating that she tried to pick up her  buPROPion  (WELLBUTRIN  XL) 300 MG 24 hr tablet but was told by the pharmacy that there was on prescription there I called the pharmacy and spoke with [facility] and was told the same thing please resend prescription to pharmacy    Preferred Pharmacies   [facility] [id number] - [place], [place] - [phone] AT [facility] OF [address] Phone: [phone]  Fax: [phone]

## 2024-08-20 ENCOUNTER — Telehealth (HOSPITAL_COMMUNITY): Admitting: Psychiatry
